# Patient Record
Sex: Male | Born: 1995 | Race: White | Hispanic: No | Marital: Single | Smoking: Never smoker
Health system: Southern US, Community
[De-identification: ages and names within clinical notes are randomized; demographics above are authoritative.]

## PROBLEM LIST (undated history)

## (undated) DIAGNOSIS — F419 Anxiety disorder, unspecified: Secondary | ICD-10-CM

## (undated) DIAGNOSIS — F329 Major depressive disorder, single episode, unspecified: Secondary | ICD-10-CM

## (undated) DIAGNOSIS — F32A Depression, unspecified: Secondary | ICD-10-CM

---

## 2015-01-14 ENCOUNTER — Other Ambulatory Visit: Payer: Self-pay | Admitting: Family Medicine

## 2015-01-14 DIAGNOSIS — IMO0002 Reserved for concepts with insufficient information to code with codable children: Secondary | ICD-10-CM

## 2015-01-14 DIAGNOSIS — R229 Localized swelling, mass and lump, unspecified: Principal | ICD-10-CM

## 2015-01-14 DIAGNOSIS — N4400 Torsion of testis, unspecified: Secondary | ICD-10-CM

## 2015-01-15 ENCOUNTER — Ambulatory Visit
Admission: RE | Admit: 2015-01-15 | Discharge: 2015-01-15 | Disposition: A | Discharge status: Home / Self Care | Payer: BLUE CROSS/BLUE SHIELD | Source: Ambulatory Visit | Attending: Family Medicine | Admitting: Family Medicine

## 2015-01-15 DIAGNOSIS — N508 Other specified disorders of male genital organs: Secondary | ICD-10-CM | POA: Insufficient documentation

## 2015-01-15 DIAGNOSIS — I861 Scrotal varices: Secondary | ICD-10-CM | POA: Insufficient documentation

## 2015-01-15 DIAGNOSIS — R229 Localized swelling, mass and lump, unspecified: Secondary | ICD-10-CM

## 2015-01-15 DIAGNOSIS — N503 Cyst of epididymis: Secondary | ICD-10-CM | POA: Insufficient documentation

## 2015-01-15 DIAGNOSIS — IMO0002 Reserved for concepts with insufficient information to code with codable children: Secondary | ICD-10-CM

## 2015-01-15 DIAGNOSIS — N4400 Torsion of testis, unspecified: Secondary | ICD-10-CM

## 2015-03-12 ENCOUNTER — Other Ambulatory Visit: Payer: Self-pay | Admitting: Psychiatry

## 2015-03-30 ENCOUNTER — Other Ambulatory Visit: Payer: Self-pay

## 2015-03-30 DIAGNOSIS — F331 Major depressive disorder, recurrent, moderate: Secondary | ICD-10-CM

## 2015-03-30 NOTE — Telephone Encounter (Signed)
faxed requested received to get a refill on trazodone 50mg  take 1 table by mouth at bedtime. 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

## 2015-03-31 MED ORDER — TRAZODONE HCL 50 MG PO TABS: 50.0000 mg | ORAL_TABLET | Freq: Every day | ORAL | Status: DC

## 2015-04-01 ENCOUNTER — Other Ambulatory Visit: Payer: Self-pay

## 2015-04-01 DIAGNOSIS — F4010 Social phobia, unspecified: Secondary | ICD-10-CM | POA: Insufficient documentation

## 2015-04-01 DIAGNOSIS — F331 Major depressive disorder, recurrent, moderate: Secondary | ICD-10-CM | POA: Insufficient documentation

## 2015-04-01 DIAGNOSIS — Z8709 Personal history of other diseases of the respiratory system: Secondary | ICD-10-CM | POA: Insufficient documentation

## 2015-05-19 ENCOUNTER — Telehealth: Payer: Self-pay | Admitting: Psychiatry

## 2015-05-19 NOTE — Telephone Encounter (Signed)
Please schedule appt for medication refill.

## 2015-11-10 ENCOUNTER — Other Ambulatory Visit: Payer: Self-pay | Admitting: Family Medicine

## 2015-11-10 ENCOUNTER — Ambulatory Visit
Admission: RE | Admit: 2015-11-10 | Discharge: 2015-11-10 | Disposition: A | Discharge status: Home / Self Care | Payer: BLUE CROSS/BLUE SHIELD | Source: Ambulatory Visit | Attending: Family Medicine | Admitting: Family Medicine

## 2015-11-10 DIAGNOSIS — S6991XA Unspecified injury of right wrist, hand and finger(s), initial encounter: Secondary | ICD-10-CM

## 2015-11-10 DIAGNOSIS — X58XXXA Exposure to other specified factors, initial encounter: Secondary | ICD-10-CM | POA: Diagnosis not present

## 2016-09-20 IMAGING — US US SCROTUM
1 series · 13 of 25 positions shown · non-contrast
Comparison: None

CLINICAL DATA: Palpable RIGHT testicular mass, assessment for tumor

EXAM:
SCROTAL ULTRASOUND
DOPPLER ULTRASOUND OF THE TESTICLES
TECHNIQUE: Complete ultrasound examination of the testicles, epididymis, and
other scrotal structures was performed. Color and spectral Doppler
ultrasound were also utilized to evaluate blood flow to the
testicles.

[Series 1: us scrotum · 0.07mm/px · 13 of 112 slices shown]
[im 1/112]
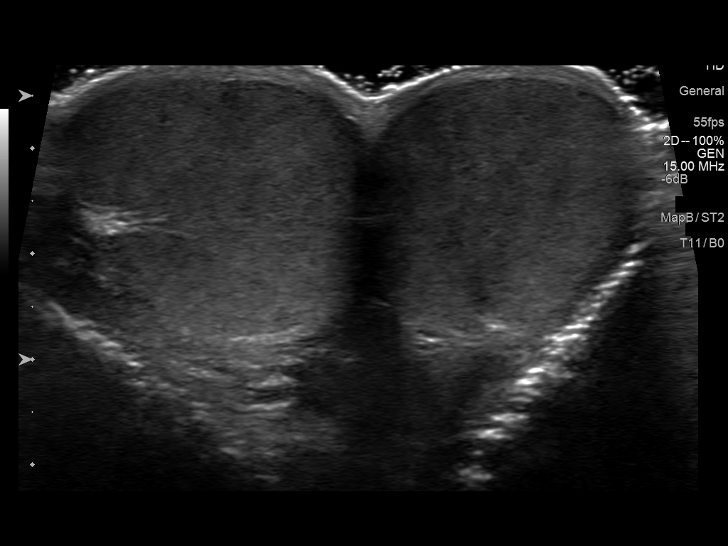
[im 10/112]
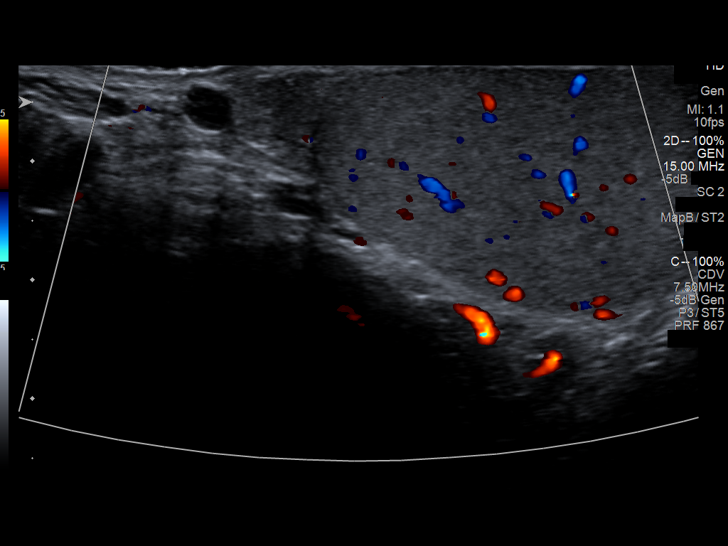
[im 19/112]
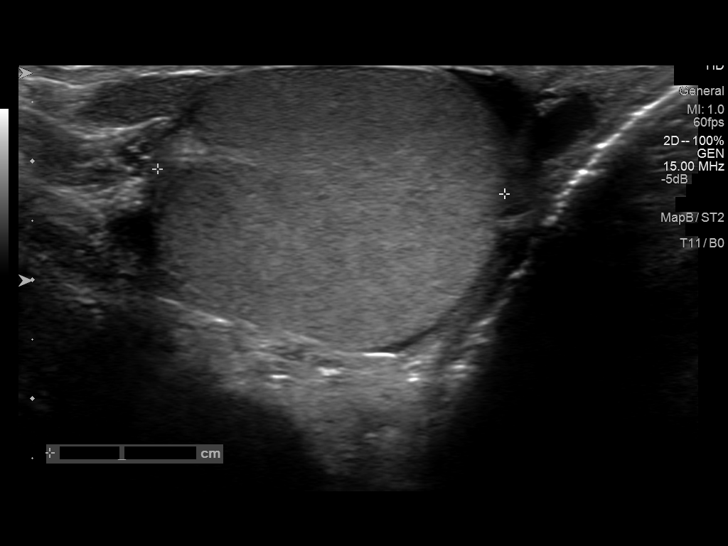
[im 28/112]
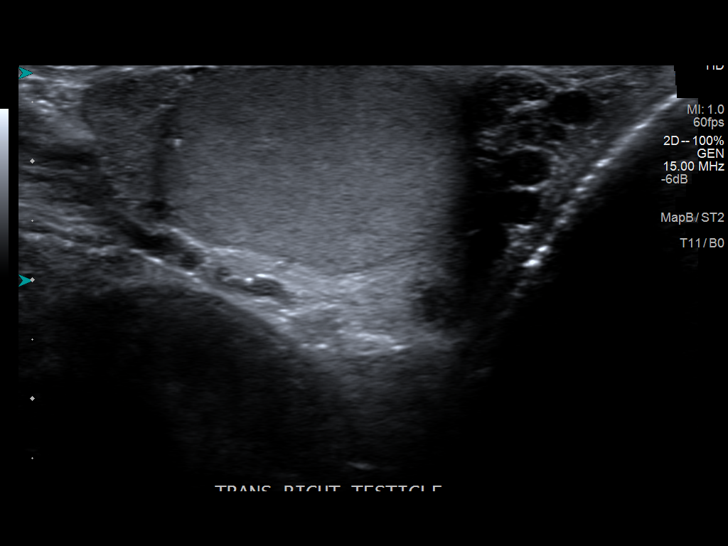
[im 38/112]
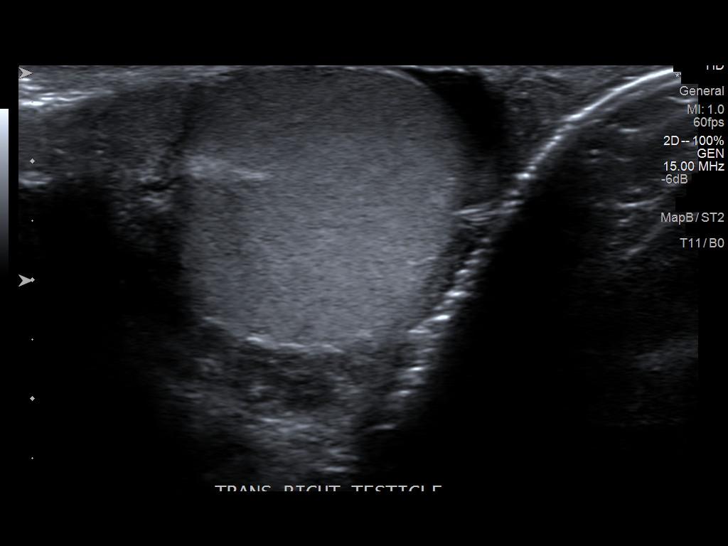
[im 47/112]
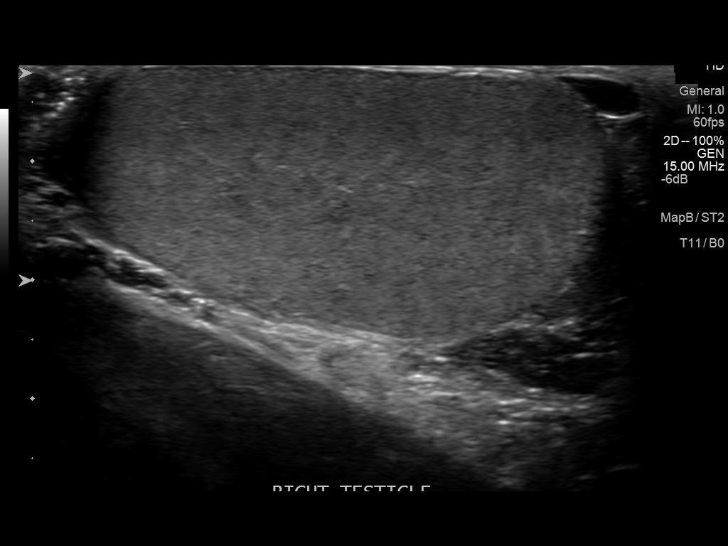
[im 56/112]
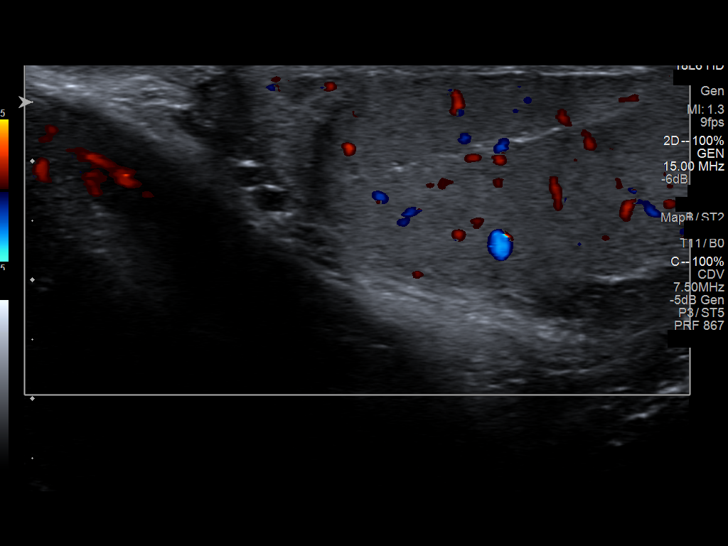
[im 65/112]
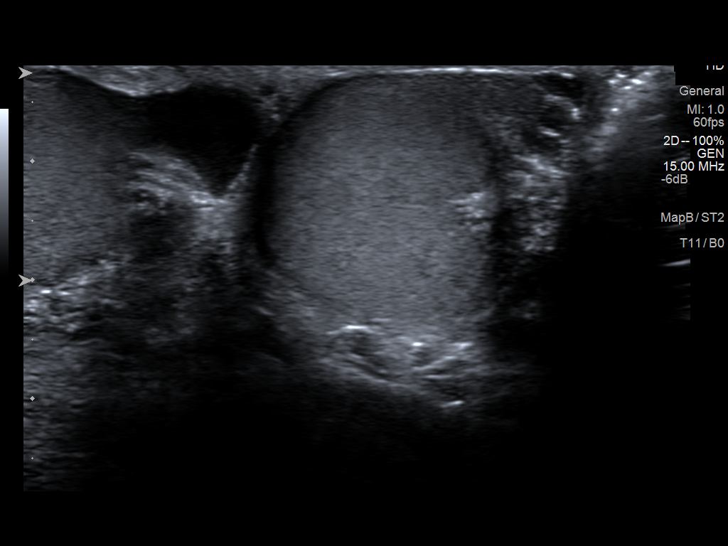
[im 75/112]
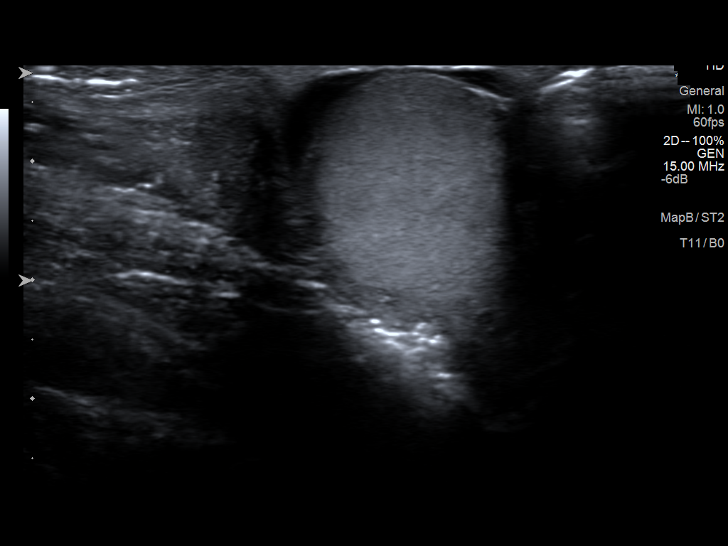
[im 84/112]
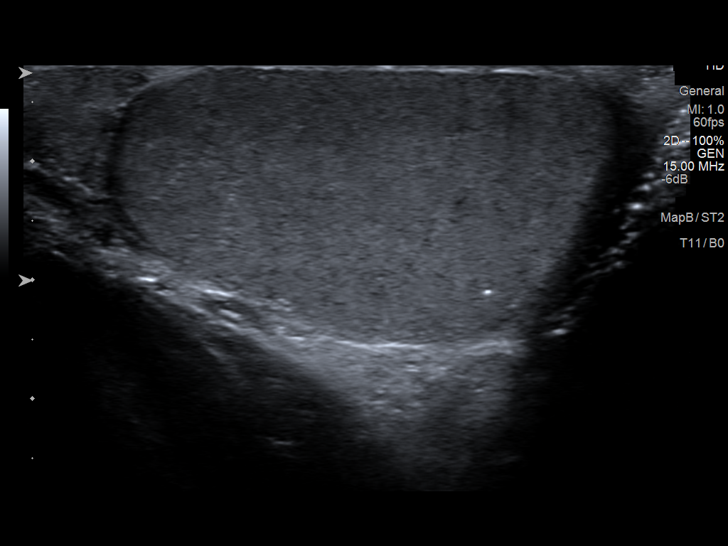
[im 93/112]
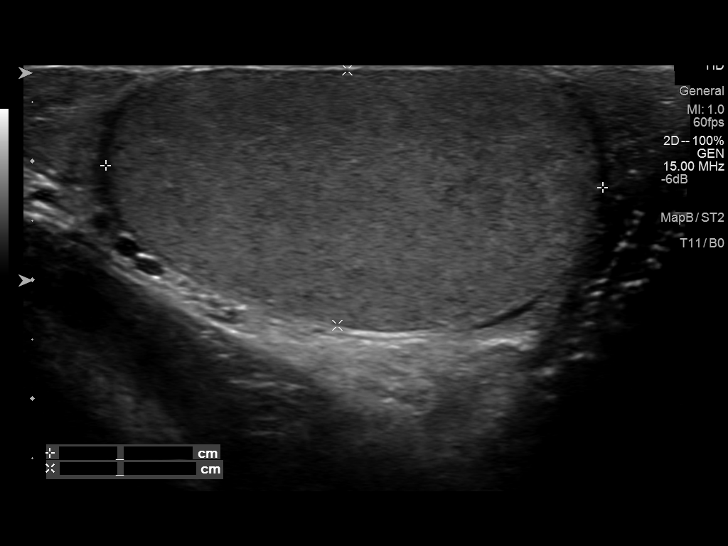
[im 102/112]
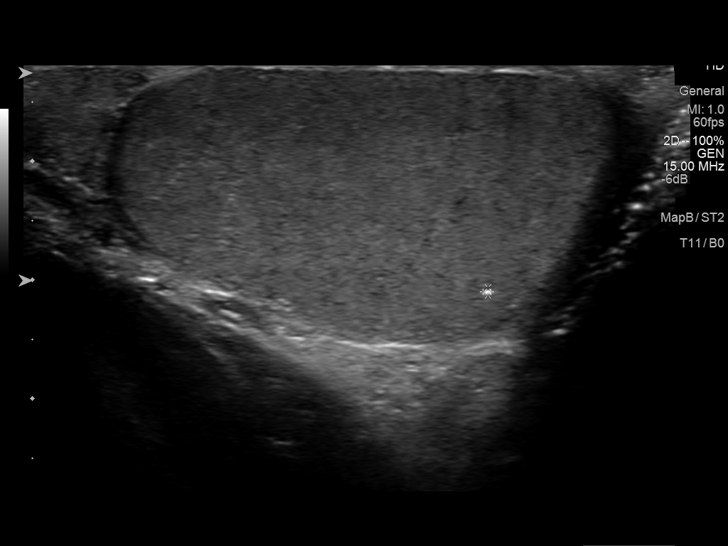
[im 112/112]
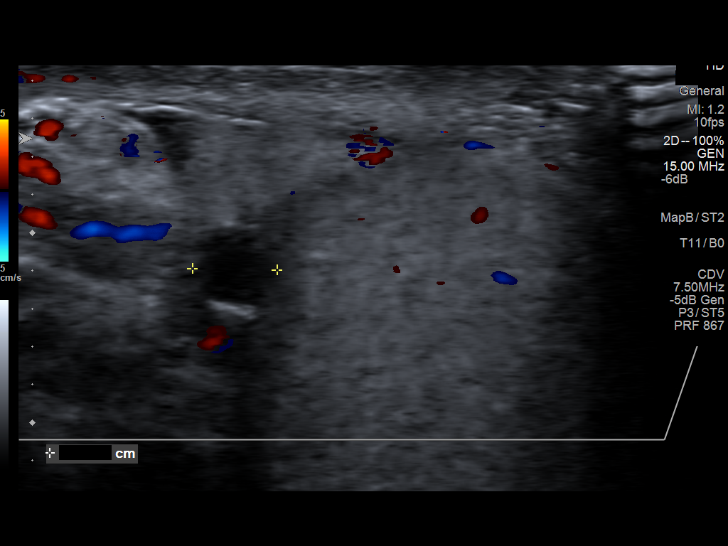

[13 of 25 positions shown; findings below may reference images not displayed]

FINDINGS: Right testicle

Measurements: 4.5 x 2.1 x 2.9 cm. Normal morphology without mass or
calcification. Internal blood flow present within RIGHT testis on
color Doppler imaging. Tiny RIGHT appendix testis.

Left testicle

Measurements: 4.2 x 2.2 x 2.5 cm. Normal morphology without mass or
calcification. Internal blood flow present on color Doppler imaging,
symmetric with RIGHT.

Right epididymis: Small RIGHT epididymal cysts noted up to 5 mm
diameter at epididymal head.

Left epididymis:  Normal in size and appearance.

Hydrocele:  None visualized.

Varicocele:  LEFT varicocele.

Pulsed Doppler interrogation of both testes demonstrates normal low
resistance arterial and venous waveforms bilaterally.
IMPRESSION: Small RIGHT epididymal cysts and tiny RIGHT appendix testis.

LEFT varicocele.

Otherwise negative exam without evidence of testicular mass.

## 2017-01-03 ENCOUNTER — Inpatient Hospital Stay
Admission: EM | Admit: 2017-01-03 | Discharge: 2017-01-05 | DRG: 372 | Disposition: A | Discharge status: Home / Self Care | Payer: BLUE CROSS/BLUE SHIELD | Attending: Internal Medicine | Admitting: Internal Medicine

## 2017-01-03 ENCOUNTER — Emergency Department
Admission: EM | Admit: 2017-01-03 | Discharge: 2017-01-03 | Disposition: A | Discharge status: Home / Self Care | Payer: BLUE CROSS/BLUE SHIELD | Source: Home / Self Care | Attending: Emergency Medicine | Admitting: Emergency Medicine

## 2017-01-03 ENCOUNTER — Encounter: Payer: Self-pay | Admitting: Emergency Medicine

## 2017-01-03 DIAGNOSIS — A046 Enteritis due to Yersinia enterocolitica: Secondary | ICD-10-CM | POA: Insufficient documentation

## 2017-01-03 DIAGNOSIS — F419 Anxiety disorder, unspecified: Secondary | ICD-10-CM | POA: Diagnosis present

## 2017-01-03 DIAGNOSIS — K529 Noninfective gastroenteritis and colitis, unspecified: Secondary | ICD-10-CM | POA: Diagnosis present

## 2017-01-03 DIAGNOSIS — R109 Unspecified abdominal pain: Secondary | ICD-10-CM

## 2017-01-03 DIAGNOSIS — K5150 Left sided colitis without complications: Secondary | ICD-10-CM | POA: Diagnosis present

## 2017-01-03 DIAGNOSIS — A058 Other specified bacterial foodborne intoxications: Secondary | ICD-10-CM

## 2017-01-03 DIAGNOSIS — F329 Major depressive disorder, single episode, unspecified: Secondary | ICD-10-CM | POA: Diagnosis present

## 2017-01-03 DIAGNOSIS — Z79899 Other long term (current) drug therapy: Secondary | ICD-10-CM | POA: Diagnosis not present

## 2017-01-03 DIAGNOSIS — R197 Diarrhea, unspecified: Secondary | ICD-10-CM

## 2017-01-03 DIAGNOSIS — A09 Infectious gastroenteritis and colitis, unspecified: Secondary | ICD-10-CM | POA: Diagnosis present

## 2017-01-03 DIAGNOSIS — K625 Hemorrhage of anus and rectum: Secondary | ICD-10-CM | POA: Diagnosis not present

## 2017-01-03 HISTORY — DX: Major depressive disorder, single episode, unspecified: F32.9

## 2017-01-03 HISTORY — DX: Depression, unspecified: F32.A

## 2017-01-03 HISTORY — DX: Anxiety disorder, unspecified: F41.9

## 2017-01-03 LAB — GASTROINTESTINAL PANEL BY PCR, STOOL (REPLACES STOOL CULTURE)

## 2017-01-03 LAB — CBC
HEMATOCRIT: 49.4 % (ref 40.0–52.0)
HEMATOCRIT: 48.3 % (ref 40.0–52.0)
HEMOGLOBIN: 17 g/dL (ref 13.0–18.0)
Hemoglobin: 16.5 g/dL (ref 13.0–18.0)
MCH: 31.2 pg (ref 26.0–34.0)
MCH: 31.3 pg (ref 26.0–34.0)
MCHC: 34.4 g/dL (ref 32.0–36.0)
MCHC: 34.1 g/dL (ref 32.0–36.0)
MCV: 90.7 fL (ref 80.0–100.0)
MCV: 91.8 fL (ref 80.0–100.0)
PLATELETS: 309 10*3/uL (ref 150–440)
Platelets: 340 10*3/uL (ref 150–440)
RBC: 5.45 MIL/uL (ref 4.40–5.90)
RBC: 5.26 MIL/uL (ref 4.40–5.90)
RDW: 12.3 % (ref 11.5–14.5)
RDW: 12.2 % (ref 11.5–14.5)
WBC: 10.9 10*3/uL — AB (ref 3.8–10.6)
WBC: 16.6 10*3/uL — ABNORMAL HIGH (ref 3.8–10.6)

## 2017-01-03 LAB — BASIC METABOLIC PANEL
ANION GAP: 8 (ref 5–15)
BUN: 10 mg/dL (ref 6–20)
CALCIUM: 9.4 mg/dL (ref 8.9–10.3)
CO2: 29 mmol/L (ref 22–32)
CREATININE: 0.9 mg/dL (ref 0.61–1.24)
Chloride: 102 mmol/L (ref 101–111)
GFR calc Af Amer: >60 mL/min (ref >60)
GLUCOSE: 103 mg/dL — AB (ref 65–99)
Potassium: 3.9 mmol/L (ref 3.5–5.1)
Sodium: 139 mmol/L (ref 135–145)

## 2017-01-03 LAB — COMPREHENSIVE METABOLIC PANEL
ALBUMIN: 4.5 g/dL (ref 3.5–5.0)
ALT: 16 U/L — ABNORMAL LOW (ref 17–63)
ANION GAP: 8 (ref 5–15)
AST: 20 U/L (ref 15–41)
Alkaline Phosphatase: 50 U/L (ref 38–126)
BUN: 13 mg/dL (ref 6–20)
CHLORIDE: 103 mmol/L (ref 101–111)
CO2: 27 mmol/L (ref 22–32)
Calcium: 9.7 mg/dL (ref 8.9–10.3)
Creatinine, Ser: 1.08 mg/dL (ref 0.61–1.24)
GFR calc Af Amer: >60 mL/min (ref >60)
GFR calc non Af Amer: >60 mL/min (ref >60)
GLUCOSE: 116 mg/dL — AB (ref 65–99)
Potassium: 3.8 mmol/L (ref 3.5–5.1)
Sodium: 138 mmol/L (ref 135–145)
Total Bilirubin: 0.8 mg/dL (ref 0.3–1.2)
Total Protein: 7.7 g/dL (ref 6.5–8.1)

## 2017-01-03 LAB — LIPASE, BLOOD: LIPASE: 30 U/L (ref 11–51)

## 2017-01-03 MED ORDER — SULFAMETHOXAZOLE-TRIMETHOPRIM 800-160 MG PO TABS
1.0000 | ORAL_TABLET | Freq: Two times a day (BID) | ORAL | Status: DC
  Administered 2017-01-03 – 2017-01-05 (×4): 1 via ORAL
  Filled 2017-01-03 (×6): qty 1

## 2017-01-03 MED ORDER — ACETAMINOPHEN 650 MG RE SUPP: 650.0000 mg | Freq: Four times a day (QID) | RECTAL | Status: DC | PRN

## 2017-01-03 MED ORDER — ONDANSETRON HCL 4 MG/2ML IJ SOLN
4.0000 mg | Freq: Once | INTRAMUSCULAR | Status: AC
  Administered 2017-01-03: 4 mg via INTRAVENOUS
  Filled 2017-01-03: qty 2

## 2017-01-03 MED ORDER — MORPHINE SULFATE (PF) 4 MG/ML IV SOLN
4.0000 mg | Freq: Once | INTRAVENOUS | Status: AC
  Administered 2017-01-03: 4 mg via INTRAVENOUS
  Filled 2017-01-03: qty 1

## 2017-01-03 MED ORDER — ACETAMINOPHEN 325 MG PO TABS
650.0000 mg | ORAL_TABLET | Freq: Four times a day (QID) | ORAL | Status: DC | PRN
  Administered 2017-01-03 – 2017-01-04 (×3): 650 mg via ORAL
  Filled 2017-01-03 (×3): qty 2

## 2017-01-03 MED ORDER — FENTANYL CITRATE (PF) 100 MCG/2ML IJ SOLN
75.0000 ug | Freq: Once | INTRAMUSCULAR | Status: AC
Start: 2017-01-03 — End: 2017-01-03
  Administered 2017-01-03: 75 ug via INTRAVENOUS

## 2017-01-03 MED ORDER — TRAZODONE HCL 50 MG PO TABS
50.0000 mg | ORAL_TABLET | Freq: Every day | ORAL | Status: DC
  Administered 2017-01-03 – 2017-01-04 (×2): 50 mg via ORAL
  Filled 2017-01-03 (×3): qty 1

## 2017-01-03 MED ORDER — SODIUM CHLORIDE 0.9 % IV BOLUS (SEPSIS)
1000.0000 mL | Freq: Once | INTRAVENOUS | Status: AC
  Administered 2017-01-03: 1000 mL via INTRAVENOUS

## 2017-01-03 MED ORDER — SODIUM CHLORIDE 0.9 % IV SOLN
INTRAVENOUS | Status: DC
  Administered 2017-01-03 – 2017-01-05 (×4): via INTRAVENOUS

## 2017-01-03 MED ORDER — CIPROFLOXACIN HCL 500 MG PO TABS
ORAL_TABLET | ORAL | Status: AC
  Filled 2017-01-03: qty 1

## 2017-01-03 MED ORDER — ONDANSETRON HCL 4 MG/2ML IJ SOLN: 4.0000 mg | Freq: Four times a day (QID) | INTRAMUSCULAR | Status: DC | PRN

## 2017-01-03 MED ORDER — ARIPIPRAZOLE 2 MG PO TABS
2.0000 mg | ORAL_TABLET | Freq: Every day | ORAL | Status: DC
  Filled 2017-01-03 (×2): qty 1

## 2017-01-03 MED ORDER — CIPROFLOXACIN HCL 500 MG PO TABS
500.0000 mg | ORAL_TABLET | Freq: Once | ORAL | Status: AC
  Administered 2017-01-03: 500 mg via ORAL

## 2017-01-03 MED ORDER — ONDANSETRON HCL 4 MG PO TABS: 4.0000 mg | ORAL_TABLET | Freq: Four times a day (QID) | ORAL | Status: DC | PRN

## 2017-01-03 MED ORDER — FENTANYL CITRATE (PF) 100 MCG/2ML IJ SOLN
INTRAMUSCULAR | Status: AC
  Filled 2017-01-03: qty 2

## 2017-01-03 NOTE — ED Triage Notes (Signed)
Pt just discharged earlier today with food poisoning, states he went home and had some episodes of rectal bleeding. NAD, skin warm and dry.

## 2017-01-03 NOTE — Discharge Instructions (Signed)
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  Return to the emergency room immediately for any worsening symptoms including black or bloody stools, worsening diarrhea with concern for dehydration such as dry mouth and not making urine, or any new or worsening abdominal pain.

## 2017-01-03 NOTE — ED Notes (Signed)
Pt reports that he has blood in his stool - he states he was seen earlier today in the er for food poisoning - pt reports the blood is bright red in color and has only occurred once today

## 2017-01-03 NOTE — ED Triage Notes (Signed)
Pt presents to ED via [facility] with c/o lower abdominal pain that started at 0720 today. EMS reports patient had 1 episode of diarrhea this morning, no blood noted. Pt states previous diagnosis of "some kind of stomach infection" in December. Pt is alert and oriented, noted to be anxious on arrival.

## 2017-01-03 NOTE — H&P (Signed)
[employer] [facility] [place] [facility]   PATIENT NAME: Jesus Kelly    MR#:  [id number]  DATE OF BIRTH:  02-16-1996  DATE OF ADMISSION:  01/03/2017  PRIMARY CARE PHYSICIAN: Jesus Kelly   REQUESTING/REFERRING PHYSICIAN: Koren Bound MD  CHIEF COMPLAINT:   Chief Complaint  Patient presents with  . Rectal Bleeding    HISTORY OF PRESENT ILLNESS: Jesus Kelly  is a 21 y.o. male with a known history of Anxiety, depression who presents to the emergency room complaining of having rectal bleeding. Patient was seen in the emergency room earlier today with diarrhea that started earlier this morning. He reports that he actually got sick 2 weeks ago with nausea vomiting and diarrhea. After he cleaned up his room because his roommate was very sick after drinking. Then his symptoms went away and then they recurred today. He initially had liquidy bowel movement. He was given IV fluids and discharged home. His stool studies showed that he had Yersinia enterocolitica. Patient after going home started having lot of bright red blood per rectum. Therefore comes back to the emergency room. His hemoglobin is stable however his white blood cell count is increased. He denies any fevers or chills today. PAST MEDICAL HISTORY:   Past Medical History:  Diagnosis Date  . Anxiety   . Depression     PAST SURGICAL HISTORY: History reviewed. No pertinent surgical history.  SOCIAL HISTORY:  Social History  Substance Use Topics  . Smoking status: Never Smoker  . Smokeless tobacco: Never Used  . Alcohol use Yes     Comment: On weekends    FAMILY HISTORY:  Diabetes in aunt DRUG ALLERGIES: No Known Allergies  REVIEW OF SYSTEMS:   CONSTITUTIONAL: No fever, fatigue or weakness.  EYES: No blurred or double vision.  EARS, NOSE, AND THROAT: No tinnitus or ear pain.  RESPIRATORY: No cough, shortness of breath, wheezing or hemoptysis.  CARDIOVASCULAR: No chest pain, orthopnea, edema.  GASTROINTESTINAL:  No nausea, vomiting, positive diarrhea or resolved abdominal pain.  GENITOURINARY: No dysuria, hematuria.  ENDOCRINE: No polyuria, nocturia,  HEMATOLOGY: No anemia, easy bruising or bleeding SKIN: No rash or lesion. MUSCULOSKELETAL: No joint pain or arthritis.   NEUROLOGIC: No tingling, numbness, weakness.  PSYCHIATRY: No anxiety or depression.   MEDICATIONS AT HOME:  Prior to Admission medications   Medication Sig Start Date End Date Taking? Authorizing Provider  ARIPiprazole (ABILIFY) 2 MG tablet Take 2 mg by mouth at bedtime.    Yes Historical Provider, MD  traZODone (DESYREL) 50 MG tablet TAKE 1 TABLET BY MOUTH AT BEDTIME Patient not taking: Reported on 01/03/2017 03/31/15   Jesus Hale, MD  traZODone (DESYREL) 50 MG tablet Take 1 tablet (50 mg total) by mouth at bedtime. Patient not taking: Reported on 01/03/2017 03/31/15   Jesus Hale, MD      PHYSICAL EXAMINATION:   VITAL SIGNS: Blood pressure (!) 122/57, pulse 61, temperature 98.7 F (37.1 C), temperature source Oral, resp. rate 18, height 6' (1.829 m), weight 150 lb (68 kg), SpO2 100 %.  GENERAL:  21 y.o.-year-old patient lying in the bed with no acute distress.  EYES: Pupils equal, round, reactive to light and accommodation. No scleral icterus. Extraocular muscles intact.  HEENT: Head atraumatic, normocephalic. Oropharynx and nasopharynx clear.  NECK:  Supple, no jugular venous distention. No thyroid enlargement, no tenderness.  LUNGS: Normal breath sounds bilaterally, no wheezing, rales,rhonchi or crepitation. No use of accessory muscles of respiration.  CARDIOVASCULAR: S1, S2 normal.  No murmurs, rubs, or gallops.  ABDOMEN: Soft, nontender, nondistended. Bowel sounds present. No organomegaly or mass.  EXTREMITIES: No pedal edema, cyanosis, or clubbing.  NEUROLOGIC: Cranial nerves II through XII are intact. Muscle strength 5/5 in all extremities. Sensation intact. Gait not checked.  PSYCHIATRIC: The patient is alert and  oriented x 3.  SKIN: No obvious rash, lesion, or ulcer.   LABORATORY PANEL:   CBC  Recent Labs Lab 01/03/17 0831 01/03/17 1908  WBC 10.9* 16.6*  HGB 17.0 16.5  HCT 49.4 48.3  PLT 340 309  MCV 90.7 91.8  MCH 31.2 31.3  MCHC 34.4 34.1  RDW 12.3 12.2   ------------------------------------------------------------------------------------------------------------------  Chemistries   Recent Labs Lab 01/03/17 0831 01/03/17 1908  NA 138 139  K 3.8 3.9  CL 103 102  CO2 27 29  GLUCOSE 116* 103*  BUN 13 10  CREATININE 1.08 0.90  CALCIUM 9.7 9.4  AST 20  --   ALT 16*  --   ALKPHOS 50  --   BILITOT 0.8  --    ------------------------------------------------------------------------------------------------------------------ estimated creatinine clearance is 123.8 mL/min (by C-G formula based on SCr of 0.9 mg/dL). ------------------------------------------------------------------------------------------------------------------ No results for input(s): TSH, T4TOTAL, T3FREE, THYROIDAB in the last 72 hours.  Invalid input(s): FREET3   Coagulation profile No results for input(s): INR, PROTIME in the last 168 hours. ------------------------------------------------------------------------------------------------------------------- No results for input(s): DDIMER in the last 72 hours. -------------------------------------------------------------------------------------------------------------------  Cardiac Enzymes No results for input(s): CKMB, TROPONINI, MYOGLOBIN in the last 168 hours.  Invalid input(s): CK ------------------------------------------------------------------------------------------------------------------ Invalid input(s): POCBNP  ---------------------------------------------------------------------------------------------------------------  Urinalysis No results found for: COLORURINE, APPEARANCEUR, LABSPEC, PHURINE, GLUCOSEU, HGBUR, BILIRUBINUR, KETONESUR,  PROTEINUR, UROBILINOGEN, NITRITE, LEUKOCYTESUR   RADIOLOGY: No results found.  EKG: No orders found for this or any previous visit.  IMPRESSION AND PLAN: Patient is a 21 year old presenting with bloody diarrhea  1. Yersinia enterocolitica At this point we will follow his hemoglobin. I will start him on Bactrim. If his abdominal pain worsens or WBC count increases will need a CT of the abdomen We'll do clear liquid diet  2. Depression and anxiety will discontinue his home regimen  All the records are reviewed and case discussed with ED provider. Management plans discussed with the patient, family and they are in agreement.  CODE STATUS: Code Status History    This patient does not have a recorded code status. Please follow your organizational policy for patients in this situation.       TOTAL TIME TAKING CARE OF THIS PATIENT: 50 minutes.    Auburn Bilberry M.D on 01/03/2017 at 8:25 PM  Between 7am to 6pm - Pager - [phone]  After 6pm go to [email or web address] - password [email or web address]  [facility]  Office  [phone]  CC: Primary care physician; Jesus Kelly

## 2017-01-03 NOTE — ED Provider Notes (Signed)
[facility] Emergency Department Provider Note ____________________________________________   I have reviewed the triage vital signs and the triage nursing note.  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  Moderate nausea without significant vomiting.  He did have vomiting and diarrhea last week after his roommate had similar symptoms, but that seemed to be better.  Reports edible marijuana yesterday.  No chest pain or trouble breathing. No fever. No known bad food. No significant travel history.    Past Medical History:  Diagnosis Date  . Anxiety   . Depression     Patient Active Problem List   Diagnosis Date Noted  . History of asthma 04/01/2015  . Depression, major, recurrent, moderate (HCC) 04/01/2015  . 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 No pertinent surgical history.  Prior to Admission medications   Medication Sig Start Date End Date Taking? Authorizing Provider  escitalopram (LEXAPRO) 10 MG tablet Take by mouth. 01/13/15   Historical Provider, MD  traZODone (DESYREL) 50 MG tablet TAKE 1 TABLET BY MOUTH AT BEDTIME 03/31/15   Brandy Hale, MD  traZODone (DESYREL) 50 MG tablet Take 1 tablet (50 mg total) by mouth at bedtime. 03/31/15   Brandy Hale, MD    No Known Allergies  History reviewed. No pertinent family history.  Social History Social History  Substance Use Topics  . Smoking status: Never Smoker  . Smokeless tobacco: Never Used  . Alcohol use Yes     Comment: On weekends    Review of Systems  Constitutional: Negative for fever. Eyes: Negative for visual changes. ENT: Negative for sore throat. Cardiovascular: Negative for chest pain. Respiratory: Negative for shortness of breath. Gastrointestinal: No bloody stool. Genitourinary:  Negative for dysuria. Musculoskeletal: Negative for back pain. Skin: Negative for rash. Neurological: Negative for headache. 10 point Review of Systems otherwise negative ____________________________________________   PHYSICAL EXAM:  VITAL SIGNS: ED Triage Vitals  Enc Vitals Group     BP 01/03/17 0839 138/88     Pulse Rate 01/03/17 0839 60     Resp 01/03/17 0839 17     Temp --      Temp src --      SpO2 01/03/17 0838 99 %     Weight 01/03/17 0839 150 lb (68 kg)     Height 01/03/17 0839 6' 0.5" (1.842 m)     Head Circumference --      Peak Flow --      Pain Score 01/03/17 0839 6     Pain Loc --      Pain Edu? --      Excl. in GC? --      Constitutional: Alert and oriented. Well appearing and in no distress. HEENT   Head: Normocephalic and atraumatic.      Eyes: Conjunctivae are normal. PERRL. Normal extraocular movements.      Ears:         Nose: No congestion/rhinnorhea.   Mouth/Throat: Mucous membranes are moist.   Neck: No stridor. Cardiovascular/Chest: Normal rate, regular rhythm.  No murmurs, rubs, or gallops. Respiratory: Normal respiratory effort without tachypnea nor retractions. Breath sounds are clear and equal bilaterally. No wheezes/rales/rhonchi. Gastrointestinal: Soft. No distention, no guarding, no rebound. Mild tenderness diffusely more so over the suprapubic area without any focal McBurney's point tenderness.  Genitourinary/rectal:Deferred Musculoskeletal: Nontender  with normal range of motion in all extremities. No joint effusions.  No lower extremity tenderness.  No edema. Neurologic:  Normal speech and language. No gross or focal neurologic deficits are appreciated. Skin:  Skin is warm, dry and intact. No rash noted. Psychiatric: Mood and affect are normal. Speech and behavior are normal. Patient exhibits appropriate insight and judgment.   ____________________________________________  LABS (pertinent positives/negatives)  Labs Reviewed   GASTROINTESTINAL PANEL BY PCR, STOOL (REPLACES STOOL CULTURE) - Abnormal; Notable for the following:       Result Value   Yersinia enterocolitica DETECTED (*)    All other components within normal limits  COMPREHENSIVE METABOLIC PANEL - Abnormal; Notable for the following:    Glucose, Bld 116 (*)    ALT 16 (*)    All other components within normal limits  CBC - Abnormal; Notable for the following:    WBC 10.9 (*)    All other components within normal limits  LIPASE, BLOOD  URINALYSIS, COMPLETE (UACMP) WITH MICROSCOPIC    ____________________________________________    EKG I, Governor Rooks, MD, the attending physician have personally viewed and interpreted all ECGs.  None ____________________________________________  RADIOLOGY All Xrays were viewed by me. Imaging interpreted by Radiologist.  None __________________________________________  PROCEDURES  Procedure(s) performed: None  Critical Care performed: None  ____________________________________________   ED COURSE / ASSESSMENT AND PLAN  Pertinent labs & imaging results that were available during my care of the patient were reviewed by me and considered in my medical decision making (see chart for details).   Mr. Pulse came in by EMS for episode of severe cramping associated with ultimately multiple episodes of watery diarrhea this morning. He's feeling little bit better than he did earlier.  Reassuring vital signs. Overall reassuring physical exam. Does not appear clinically dehydrated, but I am going to give him a 1 L normal saline bolus with some symptomatic medications. Abdomen exam seems less likely for surgical abdominal emergency, and more consistent with the history of GI illness.  He did have a diarrhea bowel movement here and it came back positive for Yersinia enterocolitica.  I did discuss with him, and he did say that he had bacon.  I reviewed Up To Date recommendations regarding treatment -- will hold  off on any antibiotics at this point, mild illness.  He feels better clinically. We discussed what to return for.  I'm not recommending CT imaging, and he understands this plan.    CONSULTATIONS:   None   Patient / Family / Caregiver informed of clinical course, medical decision-making process, and agree with plan.   I discussed return precautions, follow-up instructions, and discharge instructions with patient and/or family.   ___________________________________________   FINAL CLINICAL IMPRESSION(S) / ED DIAGNOSES   Final diagnoses:  Yersinia enterocolitica food poisoning              Note: This dictation was prepared with Dragon dictation. Any transcriptional errors that result from this process are unintentional    Governor Rooks, MD 01/03/17 1158

## 2017-01-03 NOTE — ED Notes (Signed)
Pt visualized in NAD at this time, sitting in bed playing on his phone. Pt denies any needs, states pain is decreased to 4/10. Reiterated patient safety and call bell use. Pt states understanding. Will continue to monitor for further patient needs.

## 2017-01-03 NOTE — ED Provider Notes (Addendum)
[facility] Emergency Department Provider Note  ____________________________________________  Time seen: Approximately 7:09 PM  I have reviewed the triage vital signs and the nursing notes.   HISTORY  Chief Complaint Rectal Bleeding    HPI EMIDIO WARRELL is a 21 y.o. male diagnosed with Yersinia enterocolitis likely from bacon earlier today returns to the emergency department for the onset of rectal bleeding. The patient reports that he was seen earlier today for severe abdominal cramping associated with multiple episodes of diarrhea without nausea or vomiting. A stool sample was obtained which was positive for Yersinia enterocolitis. On arrival home, the patient has had multiple episodes of bright red blood per rectum or water mixed with bright red blood. He has not had any lightheadedness, shortness of breath, or fainting. No fevers or chills. His abdominal pain has significantly improved compared to earlier today.At the time of discharge, the patient's symptoms were mild and no antibiotic therapy was indicated.   Past Medical History:  Diagnosis Date  . Anxiety   . Depression     Patient Active Problem List   Diagnosis Date Noted  . History of asthma 04/01/2015  . Depression, major, recurrent, moderate (HCC) 04/01/2015  . 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 No pertinent surgical history.  Current Outpatient Rx  . Order #: [id number] Class: Historical Med  . Order #: [id number] Class: Normal  . Order #: [id number] Class: Normal    Allergies Patient has no known allergies.  No family history on file.  Social History Social History  Substance Use Topics  . Smoking status: Never Smoker  . Smokeless tobacco: Never Used  . Alcohol use Yes     Comment: On weekends    Review of Systems Constitutional: No fever/chills. Eyes: No visual changes. ENT: No sore throat. No congestion or rhinorrhea. Cardiovascular: Denies chest pain. Denies  palpitations. Respiratory: Denies shortness of breath.  No cough. Gastrointestinal: Positive diffuse abdominal pain.  No nausea, no vomiting.  Positive [place] diarrhea.  No constipation. Genitourinary: Negative for dysuria.Positive rectal bleeding. Musculoskeletal: Negative for back pain. Skin: Negative for rash. Neurological: Negative for headaches. No focal numbness, tingling or weakness.   10-point ROS otherwise negative.  ____________________________________________   PHYSICAL EXAM:  VITAL SIGNS: ED Triage Vitals  Enc Vitals Group     BP 01/03/17 1806 (!) 122/57     Pulse Rate 01/03/17 1806 61     Resp 01/03/17 1806 18     Temp 01/03/17 1806 98.7 F (37.1 C)     Temp Source 01/03/17 1806 Oral     SpO2 01/03/17 1806 100 %     Weight 01/03/17 1806 150 lb (68 kg)     Height 01/03/17 1806 6' (1.829 m)     Head Circumference --      Peak Flow --      Pain Score 01/03/17 1849 5     Pain Loc --      Pain Edu? --      Excl. in GC? --     Constitutional: Alert and oriented. Well appearing and in no acute distress. Answers questions appropriately. Eyes: Conjunctivae are normal.  EOMI. No scleral icterus. Head: Atraumatic. Nose: No congestion/rhinnorhea. Mouth/Throat: Mucous membranes are moist.  Neck: No stridor.  Supple.   Cardiovascular: Normal rate, regular rhythm. No murmurs, rubs or gallops.  Respiratory: Normal respiratory effort.  No accessory muscle use or retractions. Lungs CTAB.  No wheezes, rales or ronchi. Gastrointestinal: Soft, nontender and nondistended. No reproducible tenderness on  examination. No guarding or rebound.  No peritoneal signs. Genitourinary: No evidence of external or palpable internal hemorrhoids. The vault is empty of stool but there is some mild bloody mucous that is guaiac positive. No significant discomfort with rectal examination. Musculoskeletal: No LE edema.  Neurologic:  A&Ox3.  Speech is clear.  Face and smile are symmetric.  EOMI.  Moves  all extremities well. Skin:  Skin is warm, dry and intact. No rash noted. Psychiatric: Mood and affect are normal. Speech and behavior are normal.  Normal judgement.  ____________________________________________   LABS (all labs ordered are listed, but only abnormal results are displayed)  Labs Reviewed  CBC - Abnormal; Notable for the following:       Result Value   WBC 16.6 (*)    All other components within normal limits  BASIC METABOLIC PANEL - Abnormal; Notable for the following:    Glucose, Bld 103 (*)    All other components within normal limits   ____________________________________________  EKG  Not indicated ____________________________________________  RADIOLOGY  No results found.  ____________________________________________   PROCEDURES  Procedure(s) performed: None  Procedures  Critical Care performed: No ____________________________________________   INITIAL IMPRESSION / ASSESSMENT AND PLAN / ED COURSE  Pertinent labs & imaging results that were available during my care of the patient were reviewed by me and considered in my medical decision making (see chart for [id number]).  20 y.o. male with recent diagnosis of Yersinia enterocolitis this morning returning to the emergency department for bright red blood per rectum. This is likely due to his infection, although IBD such as Crohn's disease or ulcerative colitis given recurrent episodes of diarrhea over the past several weeks is also possible. We will recheck his blood work, including blood counts. I will initiate him on ciprofloxacin although the data is unclear that antibiotic therapy will have a significant impact on his clinical course, it is indicated due to the increased severity of his infection presentation today. Reevaluation for final disposition.  ----------------------------------------- 7:45 PM on 01/03/2017 -----------------------------------------  The patient does have significant bump  in his white blood cell count from 10 earlier today to 16 now. His abdomen is soft and benign, but your skin he enterocolitis is known to cause intestinal complications including abscess or perforation. Given that he is already having bloody diarrhea, I'll plan to admit the patient for serial blood counts and monitoring of his GI bleed, as well as serial abdominal exams. No CT imaging is indicated at this time, but the admitting doctors will follow his clinical course and consider imaging for worsening symptoms.  I have spoken to the patient's mother about the plan.  ____________________________________________  FINAL CLINICAL IMPRESSION(S) / ED DIAGNOSES  Final diagnoses:  Yersinia enterocolitica food poisoning  Bloody diarrhea         NEW MEDICATIONS STARTED DURING THIS VISIT:  New Prescriptions   No medications on file      Rockne Menghini, MD 01/03/17 1946    Rockne Menghini, MD 01/03/17 2002

## 2017-01-03 NOTE — ED Notes (Signed)
This RN to bedside, pain medications administered per MD order. This Rn also able to collect GI panel. Pt is noted to continue to be alert and oriented at this time. NAD noted. Pt resting in bed at this time watching TV. Lights dimmed for patient comfort. Pt instructed to use call bell due to increased fall risk from receiving IV pain medications, pt states understanding. VSS and WNL. Will continue to monitor for further patient needs.

## 2017-01-03 NOTE — ED Notes (Signed)
Date and time results received: 01/03/17 1120 (use smartphrase ".now" to insert current time)  Test: GI panel Critical Value: Yersinia Enterocolitica  Name of Provider Notified: Dr. Shaune Pollack  Orders Received? Or Actions Taken?: No new orders

## 2017-01-04 ENCOUNTER — Inpatient Hospital Stay: Payer: BLUE CROSS/BLUE SHIELD

## 2017-01-04 LAB — CBC
HCT: 43.4 % (ref 40.0–52.0)
Hemoglobin: 15 g/dL (ref 13.0–18.0)
MCH: 31.6 pg (ref 26.0–34.0)
MCHC: 34.5 g/dL (ref 32.0–36.0)
MCV: 91.5 fL (ref 80.0–100.0)
PLATELETS: 256 10*3/uL (ref 150–440)
RBC: 4.74 MIL/uL (ref 4.40–5.90)
RDW: 12.2 % (ref 11.5–14.5)
WBC: 10.8 10*3/uL — AB (ref 3.8–10.6)

## 2017-01-04 LAB — BASIC METABOLIC PANEL
ANION GAP: 0 — AB (ref 5–15)
BUN: 8 mg/dL (ref 6–20)
CALCIUM: 8.6 mg/dL — AB (ref 8.9–10.3)
CO2: 31 mmol/L (ref 22–32)
Chloride: 109 mmol/L (ref 101–111)
Creatinine, Ser: 0.85 mg/dL (ref 0.61–1.24)
Glucose, Bld: 95 mg/dL (ref 65–99)
Potassium: 3.9 mmol/L (ref 3.5–5.1)
Sodium: 140 mmol/L (ref 135–145)

## 2017-01-04 MED ORDER — MORPHINE SULFATE (PF) 2 MG/ML IV SOLN
2.0000 mg | INTRAVENOUS | Status: DC | PRN
  Administered 2017-01-04 (×2): 2 mg via INTRAVENOUS
  Filled 2017-01-04 (×2): qty 1

## 2017-01-04 MED ORDER — IOPAMIDOL (ISOVUE-300) INJECTION 61%
100.0000 mL | Freq: Once | INTRAVENOUS | Status: AC | PRN
  Administered 2017-01-04: 14:00:00 100 mL via INTRAVENOUS

## 2017-01-04 NOTE — Progress Notes (Signed)
[facility] - [place] at [facility]   PATIENT NAME: Jesus Kelly    MR#:  [id number]  DATE OF BIRTH:  October 15, 1995  SUBJECTIVE:  CHIEF COMPLAINT:   Chief Complaint  Patient presents with  . Rectal Bleeding   - Came in with abdominal pain and bloody diarrhea. Has had 10 episodes since yesterday. -Pain is improved with morphine today. Still having bloody diarrhea  REVIEW OF SYSTEMS:  Review of Systems  Constitutional: Negative for chills, fever and malaise/fatigue.  HENT: Negative for ear discharge, hearing loss and nosebleeds.   Eyes: Negative for blurred vision and double vision.  Respiratory: Negative for cough, shortness of breath and wheezing.   Cardiovascular: Negative for chest pain, palpitations and leg swelling.  Gastrointestinal: Positive for abdominal pain, blood in stool and diarrhea. Negative for constipation, nausea and vomiting.  Genitourinary: Negative for dysuria.  Musculoskeletal: Negative for myalgias.  Neurological: Negative for dizziness, speech change, focal weakness, seizures and headaches.  Psychiatric/Behavioral: Negative for depression.    DRUG ALLERGIES:  No Known Allergies  VITALS:  Blood pressure 124/68, pulse 60, temperature 97.8 F (36.6 C), temperature source Oral, resp. rate 19, height 6' (1.829 m), weight 68 kg (150 lb), SpO2 99 %.  PHYSICAL EXAMINATION:  Physical Exam  GENERAL:  21 y.o.-year-old patient lying in the bed with no acute distress.  EYES: Pupils equal, round, reactive to light and accommodation. No scleral icterus. Extraocular muscles intact.  HEENT: Head atraumatic, normocephalic. Oropharynx and nasopharynx clear.  NECK:  Supple, no jugular venous distention. No thyroid enlargement, no tenderness.  LUNGS: Normal breath sounds bilaterally, no wheezing, rales,rhonchi or crepitation. No use of accessory muscles of respiration.  CARDIOVASCULAR: S1, S2 normal. No murmurs, rubs, or gallops.  ABDOMEN: Soft, Left lower  quadrant tenderness, nondistended. Bowel sounds present. No organomegaly or mass.  EXTREMITIES: No pedal edema, cyanosis, or clubbing.  NEUROLOGIC: Cranial nerves II through XII are intact. Muscle strength 5/5 in all extremities. Sensation intact. Gait not checked.  PSYCHIATRIC: The patient is alert and oriented x 3.  SKIN: No obvious rash, lesion, or ulcer.    LABORATORY PANEL:   CBC  Recent Labs Lab 01/04/17 0505  WBC 10.8*  HGB 15.0  HCT 43.4  PLT 256   ------------------------------------------------------------------------------------------------------------------  Chemistries   Recent Labs Lab 01/03/17 0831  01/04/17 0505  NA 138  < > 140  K 3.8  < > 3.9  CL 103  < > 109  CO2 27  < > 31  GLUCOSE 116*  < > 95  BUN 13  < > 8  CREATININE 1.08  < > 0.85  CALCIUM 9.7  < > 8.6*  AST 20  --   --   ALT 16*  --   --   ALKPHOS 50  --   --   BILITOT 0.8  --   --   < > = values in this interval not displayed. ------------------------------------------------------------------------------------------------------------------  Cardiac Enzymes No results for input(s): TROPONINI in the last 168 hours. ------------------------------------------------------------------------------------------------------------------  RADIOLOGY:  No results found.  EKG:  No orders found for this or any previous visit.  ASSESSMENT AND PLAN:   21 year old male with past medical history significant for anxiety and depression presents to hospital secondary to sudden onset of abdominal pain with bloody diarrhea.  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 No recent travel but [place] to [place] about a month ago. -  Since still having abdominal pain and bloody stools- CT abdomen today - pain meds,  IV fluids and monitor - On Bactrim currently.  #2 depression and anxiety-continue Abilify and trazodone at bedtime.  #3 leukocytosis-secondary to above. Improved  #4 DVT prophylaxis-patient  is ambulatory    All the records are reviewed and case discussed with Care Management/Social Workerr. Management plans discussed with the patient, family and they are in agreement.  CODE STATUS: Full code  TOTAL TIME TAKING CARE OF THIS PATIENT: 38 minutes.   POSSIBLE D/C IN 1-2 DAYS, DEPENDING ON CLINICAL CONDITION.   Enid Baas M.D on 01/04/2017 at 12:45 PM  Between 7am to 6pm - Pager - [phone]  After 6pm go to [email or web address] - password [email or web address]  [facility]  Office  [phone]  CC: Primary care physician; Pcp Not In System

## 2017-01-04 NOTE — Plan of Care (Signed)
Problem: Bowel/Gastric: Goal: Will show no signs and symptoms of gastrointestinal bleeding Outcome: Not Progressing Pt has had 2 medium bright red liquid stools today.  Problem: Fluid Volume: Goal: Will show no signs and symptoms of excessive bleeding Outcome: Progressing Pt VSS, hemoglobin stable.

## 2017-01-05 LAB — CBC
HEMATOCRIT: 44.5 % (ref 40.0–52.0)
Hemoglobin: 15.2 g/dL (ref 13.0–18.0)
MCH: 31.7 pg (ref 26.0–34.0)
MCHC: 34.3 g/dL (ref 32.0–36.0)
MCV: 92.4 fL (ref 80.0–100.0)
Platelets: 251 10*3/uL (ref 150–440)
RBC: 4.81 MIL/uL (ref 4.40–5.90)
RDW: 12.3 % (ref 11.5–14.5)
WBC: 8.8 10*3/uL (ref 3.8–10.6)

## 2017-01-05 LAB — BASIC METABOLIC PANEL
ANION GAP: 6 (ref 5–15)
BUN: 7 mg/dL (ref 6–20)
CALCIUM: 8.9 mg/dL (ref 8.9–10.3)
CO2: 28 mmol/L (ref 22–32)
Chloride: 105 mmol/L (ref 101–111)
Creatinine, Ser: 1.03 mg/dL (ref 0.61–1.24)
Glucose, Bld: 86 mg/dL (ref 65–99)
POTASSIUM: 4 mmol/L (ref 3.5–5.1)
Sodium: 139 mmol/L (ref 135–145)

## 2017-01-05 LAB — HIV ANTIBODY (ROUTINE TESTING W REFLEX): HIV Screen 4th Generation wRfx: NONREACTIVE

## 2017-01-05 MED ORDER — SULFAMETHOXAZOLE-TRIMETHOPRIM 800-160 MG PO TABS: 1.0000 | ORAL_TABLET | Freq: Two times a day (BID) | ORAL | 0 refills | Status: AC

## 2017-01-05 NOTE — Discharge Summary (Signed)
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 Anxiety   . Depression     HOSPITAL COURSE:   21 year old male with past medical history significant for anxiety and depression presents to hospital secondary to sudden onset of abdominal pain with bloody diarrhea.  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 No recent travel but [place] to [place] about a month ago. - CT abdomen showing left sided colitis. No abscess or perforation noted. - On Bactrim with improvement. - able to tolerate regular diet prior to discharge  #2 depression and anxiety-continue Abilify and cymbalta- no changes made to home meds.  #3 leukocytosis-secondary to above. Improved  DISCHARGE CONDITIONS:   Stable  CONSULTS OBTAINED:   None  DRUG ALLERGIES:   No Known Allergies DISCHARGE MEDICATIONS:   Allergies as of 01/05/2017   No Known Allergies     Medication List    STOP taking these medications   traZODone 50 MG tablet Commonly known as:  DESYREL     TAKE these medications   ABILIFY 2 MG tablet Generic drug:  ARIPiprazole Take 2 mg by mouth at bedtime.   DULoxetine 20 MG capsule Commonly known as:  CYMBALTA Take 40 mg by mouth daily.   sulfamethoxazole-trimethoprim 800-160 MG tablet Commonly known as:  BACTRIM DS,SEPTRA DS Take 1 tablet by mouth every 12 (twelve) hours. X 5 more days        DISCHARGE INSTRUCTIONS:   1. PCP f/u in 1-2 weeks  DIET:   Regular diet  ACTIVITY:   Activity as  tolerated  OXYGEN:   Home Oxygen: No.  Oxygen Delivery: room air  DISCHARGE LOCATION:   home   If you experience worsening of your admission symptoms, develop shortness of breath, life threatening emergency, suicidal or homicidal thoughts you must seek medical attention immediately by calling 911 or calling your MD immediately  if symptoms less severe.  You Must read complete instructions/literature along with all the possible adverse reactions/side effects for all the Medicines you take and that have been prescribed to you. Take any new Medicines after you have completely understood and accpet all the possible adverse reactions/side effects.   Please note  You were cared for by a hospitalist during your hospital stay. If you have any questions about your discharge medications or the care you received while you were in the hospital after you are discharged, you can call the unit and asked to speak with the hospitalist on call if the hospitalist that took care of you is not available. Once you are discharged, your primary care physician will handle any further medical issues. Please note that NO REFILLS for any discharge medications will be authorized once you are discharged, as it is imperative that you return to your primary care physician (or establish a relationship with a primary care physician if you do not have one) for your aftercare needs so that they can reassess your need for medications and monitor your lab values.    On the day of Discharge:  VITAL SIGNS:   Blood pressure (!) 112/59, pulse 76, temperature 98.1 F (36.7 C), temperature source Oral, resp. rate 18, height 6' (1.829 m), weight 68 kg (150 lb), SpO2 100 %.  PHYSICAL EXAMINATION:    GENERAL:  21 y.o.-year-old patient lying in the bed with no acute distress.  EYES: Pupils equal, round, reactive to light and accommodation. No scleral icterus. Extraocular muscles intact.  HEENT: Head atraumatic, normocephalic.  Oropharynx and nasopharynx clear.  NECK:  Supple, no jugular venous distention. No thyroid enlargement, no tenderness.  LUNGS: Normal breath sounds bilaterally, no wheezing, rales,rhonchi or crepitation. No use of accessory muscles of respiration.  CARDIOVASCULAR: S1, S2 normal. No murmurs, rubs, or gallops.  ABDOMEN: Soft, minimal Left lower quadrant tenderness, nondistended. Bowel sounds present. No organomegaly or mass.  EXTREMITIES: No pedal edema, cyanosis, or clubbing.  NEUROLOGIC: Cranial nerves II through XII are intact. Muscle strength 5/5 in all extremities. Sensation intact. Gait not checked.  PSYCHIATRIC: The patient is alert and oriented x 3.  SKIN: No obvious rash, lesion, or ulcer.    DATA REVIEW:   CBC  Recent Labs Lab 01/05/17 0422  WBC 8.8  HGB 15.2  HCT 44.5  PLT 251    Chemistries   Recent Labs Lab 01/03/17 0831  01/05/17 0422  NA 138  < > 139  K 3.8  < > 4.0  CL 103  < > 105  CO2 27  < > 28  GLUCOSE 116*  < > 86  BUN 13  < > 7  CREATININE 1.08  < > 1.03  CALCIUM 9.7  < > 8.9  AST 20  --   --   ALT 16*  --   --   ALKPHOS 50  --   --   BILITOT 0.8  --   --   < > = values in this interval not displayed.   Microbiology Results  Results for orders placed or performed during the hospital encounter of 01/03/17  Gastrointestinal Panel by PCR , Stool     Status: Abnormal   Collection Time: 01/03/17  9:14 AM  Result Value Ref Range Status   Campylobacter species NOT DETECTED NOT DETECTED Final   Plesimonas shigelloides NOT DETECTED NOT DETECTED Final   Salmonella species NOT DETECTED NOT DETECTED Final   Yersinia enterocolitica DETECTED (A) NOT DETECTED Final    Comment: RESULT CALLED TO, READ BACK BY AND VERIFIED WITH: LAURA CATES AT 1119 01/03/17 SDR    Vibrio species NOT DETECTED NOT DETECTED Final   Vibrio cholerae NOT DETECTED NOT DETECTED Final   Enteroaggregative E coli (EAEC) NOT DETECTED NOT DETECTED Final   Enteropathogenic E coli  (EPEC) NOT DETECTED NOT DETECTED Final   Enterotoxigenic E coli (ETEC) NOT DETECTED NOT DETECTED Final   Shiga like toxin producing E coli (STEC) NOT DETECTED NOT DETECTED Final   Shigella/Enteroinvasive E coli (EIEC) NOT DETECTED NOT DETECTED Final   Cryptosporidium NOT DETECTED NOT DETECTED Final   Cyclospora cayetanensis NOT DETECTED NOT DETECTED Final   Entamoeba histolytica NOT DETECTED NOT DETECTED Final   Giardia lamblia NOT DETECTED NOT DETECTED Final   Adenovirus F40/41 NOT DETECTED NOT DETECTED Final   Astrovirus NOT DETECTED NOT DETECTED Final   Norovirus GI/GII NOT DETECTED NOT DETECTED Final   Rotavirus A NOT DETECTED NOT DETECTED Final   Sapovirus (I, II, IV, and V) NOT DETECTED NOT DETECTED Final    RADIOLOGY:  Ct Abdomen Pelvis W Contrast  Result Date: 01/04/2017 CLINICAL DATA:  Abdominal pain and bloody diarrhea. Infectious colitis. EXAM: CT ABDOMEN AND PELVIS WITH CONTRAST TECHNIQUE: Multidetector CT imaging of the abdomen and pelvis was performed using the standard protocol following bolus administration of intravenous contrast. CONTRAST:  ISOVUE-300 IOPAMIDOL (ISOVUE-300) INJECTION 61% COMPARISON:  None. FINDINGS: Lower chest: Normal Hepatobiliary: Liver parenchyma is normal. Mild fat adjacent to the falciform ligament, a normal finding. No calcified gallstones. Pancreas: Normal Spleen: Normal Adrenals/Urinary Tract: Adrenal glands are normal. Kidneys are normal. No cyst, mass, stone or hydronephrosis. Bladder is normal. Stomach/Bowel: Small bowel is normal. Right colon is normal. Appendix is normal. There is wall thickening of the left colon consistent with nonspecific colitis. Vascular/Lymphatic: Normal Reproductive: Normal Other: Small amount of free fluid in the pelvis. Musculoskeletal: Normal IMPRESSION: Wall thickening of the left colon consistent with nonspecific colitis. Infectious colitis is consistent with the clinical history. Inflammatory bowel disease could  have a similar appearance. No evidence of abscess. Small amount of free fluid in the pelvis, probably reactive. Electronically Signed   By: Paulina Fusi M.D.   On: 01/04/2017 14:16     Management plans discussed with the patient, family and they are in agreement.  CODE STATUS:     Code Status Orders        Start     Ordered   01/03/17 2140  Full code  Continuous     01/03/17 2139    Code Status History    Date Active Date Inactive Code Status Order ID Comments User Context   This patient has a current code status but no historical code status.      TOTAL TIME TAKING CARE OF THIS PATIENT: 36 minutes.    Ra Pfiester M.D on 01/05/2017 at 2:00 PM  Between 7am to 6pm - Pager - [phone]  After 6pm go to [email or web address] - Social research officer, government  [facility]  Office  [phone]  CC: Primary care physician; Pcp Not In System   Note: This dictation was prepared with Dragon dictation along with smaller phrase technology. Any transcriptional errors that result from this process are unintentional.

## 2017-01-05 NOTE — Progress Notes (Signed)
Pt is being discharged home. Discharge papers given and explained to pt. Pt verbalized understanding. Meds reviewed with pt. RX sent to pharmacy.

## 2017-07-16 IMAGING — CR DG WRIST COMPLETE 3+V*R*
1 series · 4 of 4 positions shown · non-contrast
Comparison: None.

CLINICAL DATA: Fall.

EXAM:
RIGHT WRIST - COMPLETE 3+ VIEW

[Series 1: dg wrist complete right · 0.14mm/px · 4 of 4 slices shown]
[im 1/4]
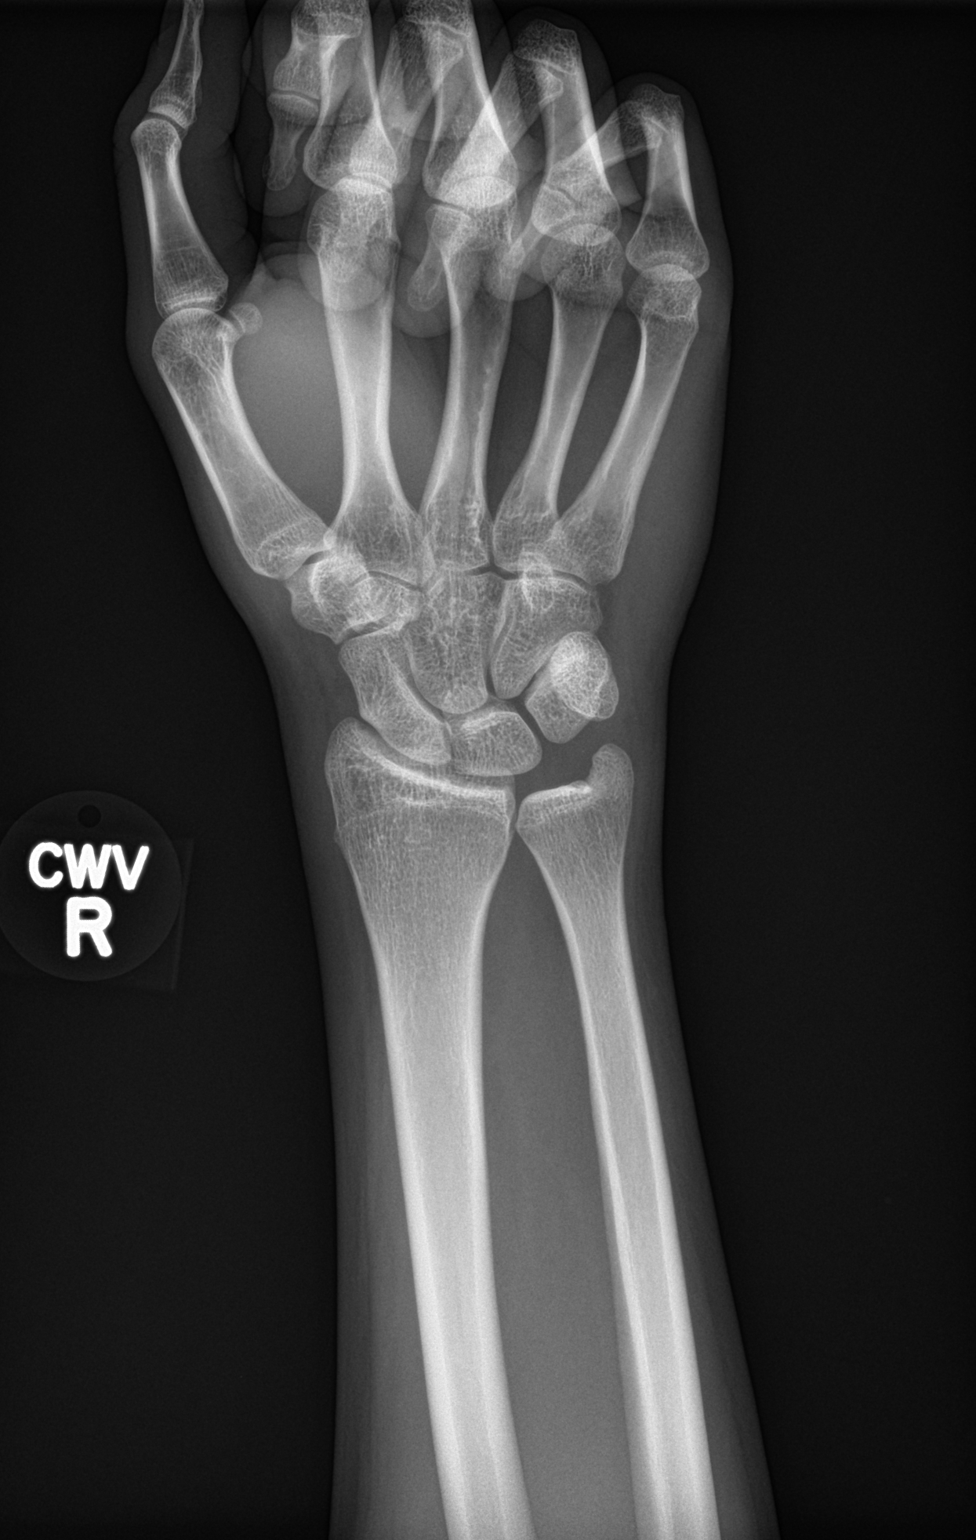
[im 2/4]
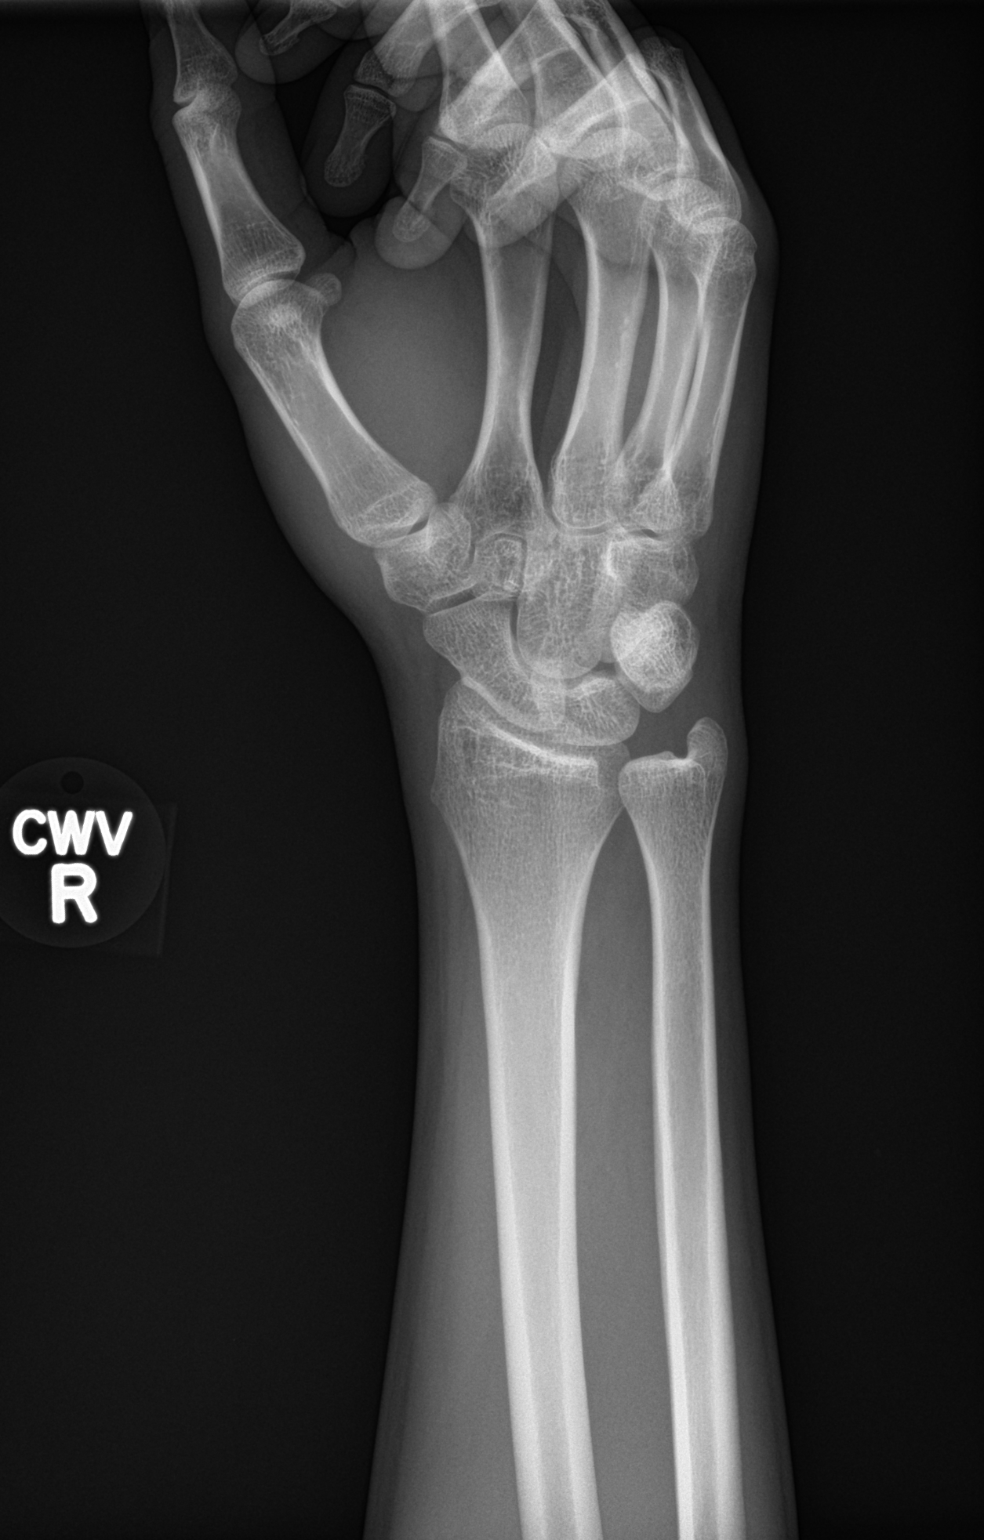
[im 3/4]
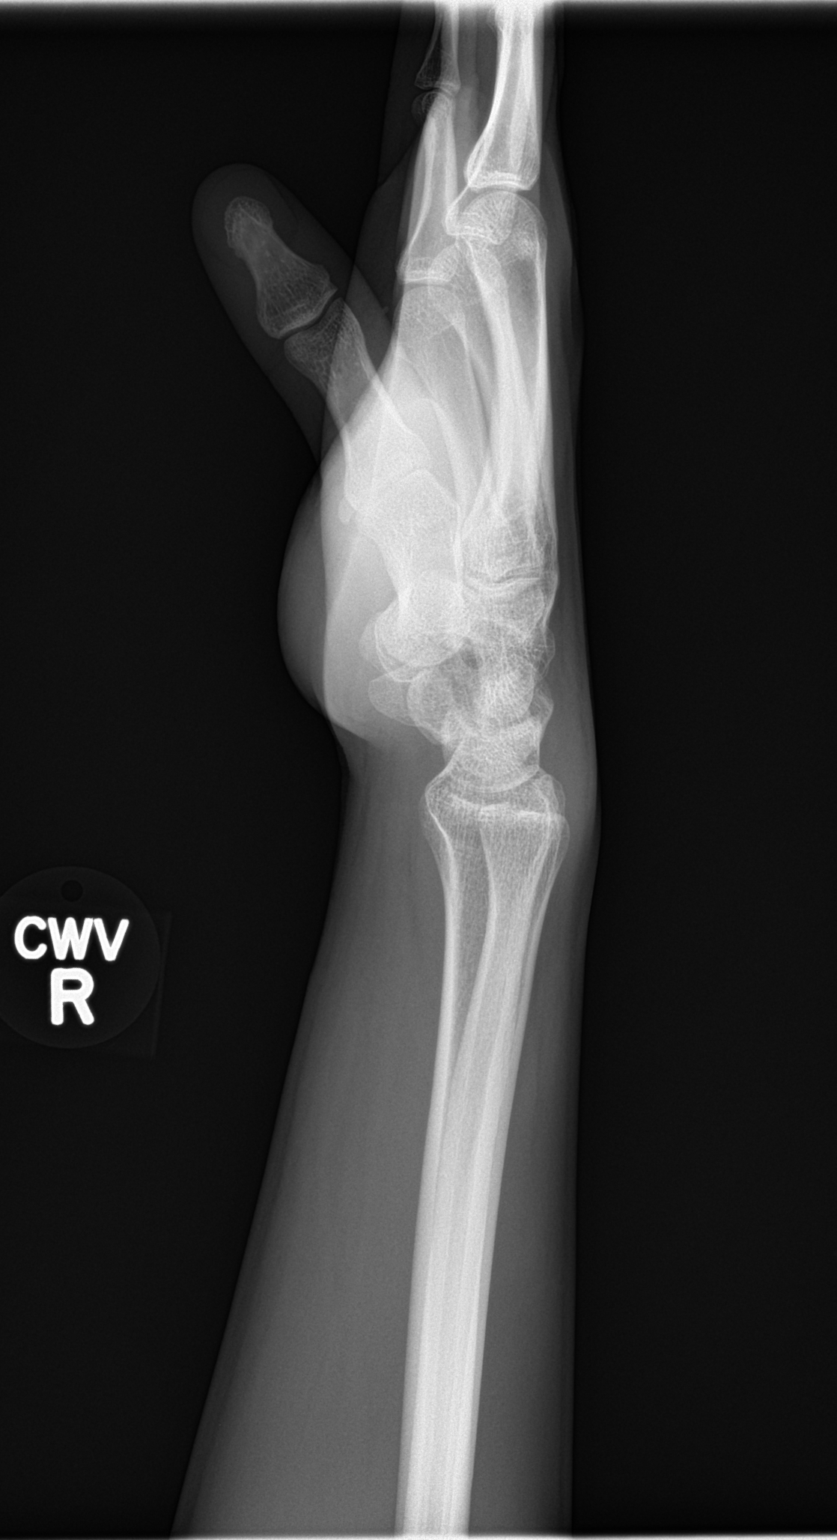
[im 4/4]
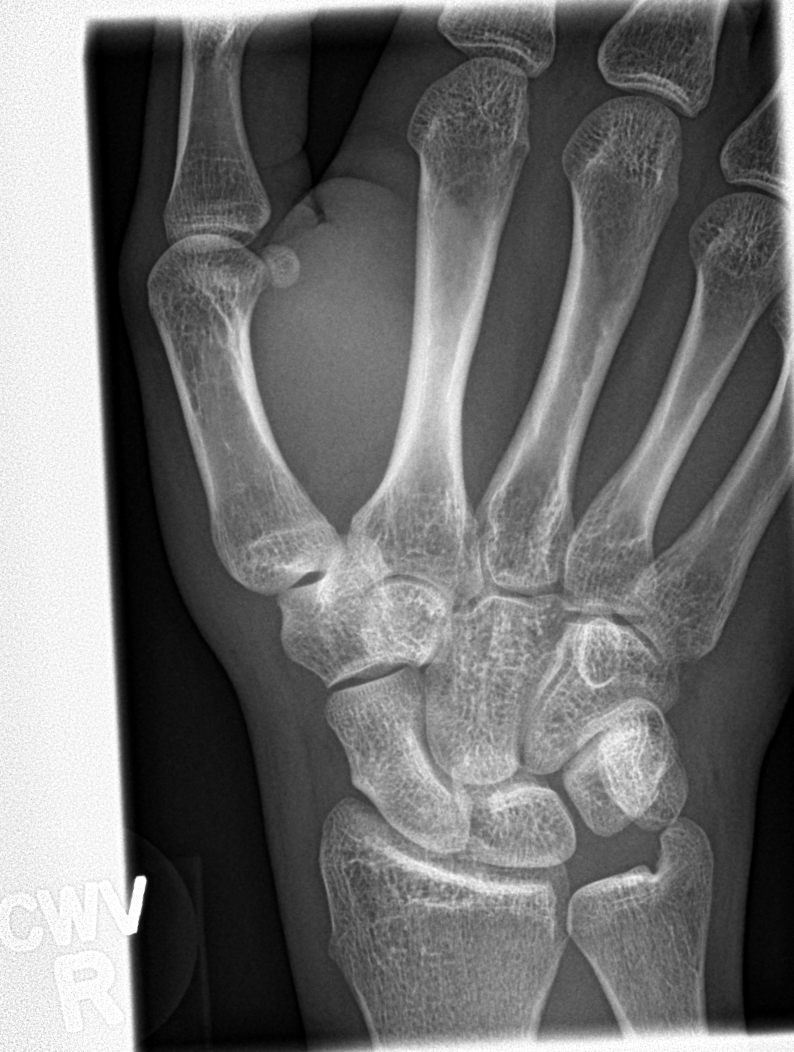

[4 of 4 positions shown; findings below may reference images not displayed]

FINDINGS: No acute bony or joint abnormality identified. No evidence of
fracture or dislocation .
IMPRESSION: No acute abnormality.

## 2018-09-10 IMAGING — CT CT ABD-PELV W/ CM
2 of 7 series · 14 of 46 positions shown, 18 images · IV contrast (APPLIED)
Comparison: None.

CLINICAL DATA: Abdominal pain and bloody diarrhea. Infectious
colitis.

EXAM:
CT ABDOMEN AND PELVIS WITH CONTRAST
TECHNIQUE: Multidetector CT imaging of the abdomen and pelvis was performed
using the standard protocol following bolus administration of
intravenous contrast.
CONTRAST:  100mL 3Z50HR-YRR IOPAMIDOL (3Z50HR-YRR) INJECTION 61%

[Series 2: axial st · axial · 0.66mm/px · z∈[-1028,-654]mm · 11 of 89 slices shown, 15 images]
[im 9/89  soft-tissue]
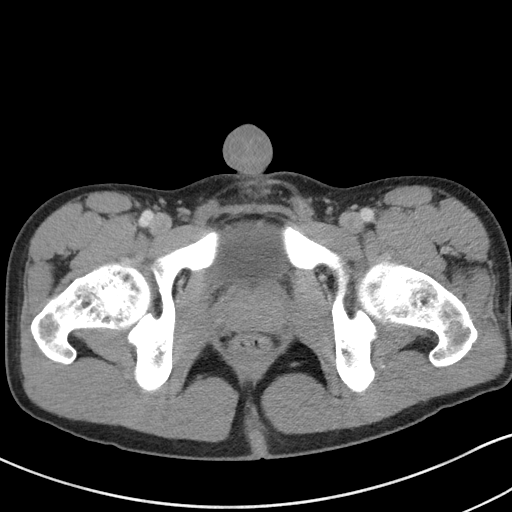
[im 9/89  bone]
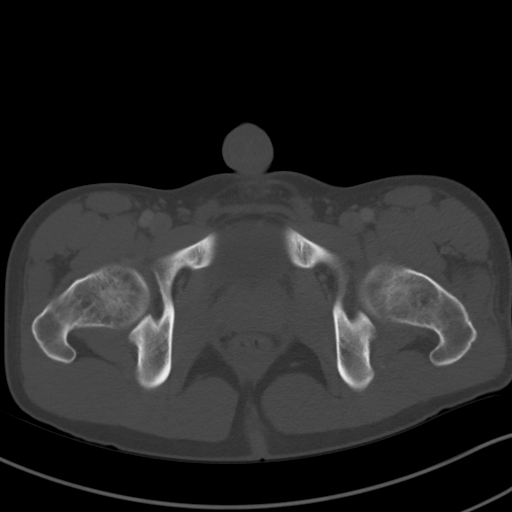
[im 18/89  soft-tissue]
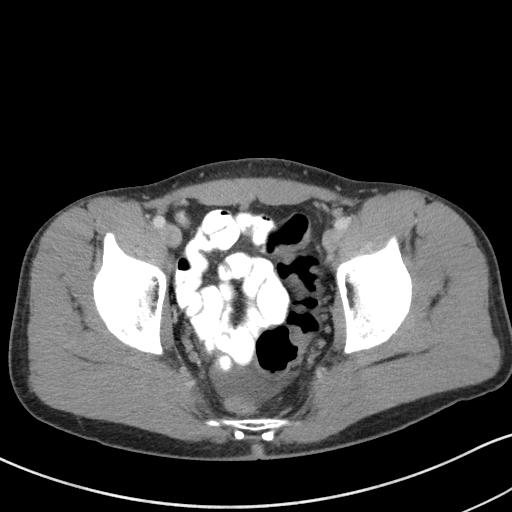
[im 27/89  soft-tissue]
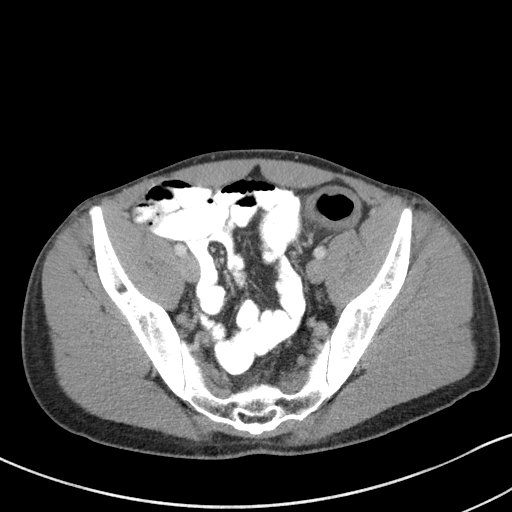
[im 36/89  soft-tissue]
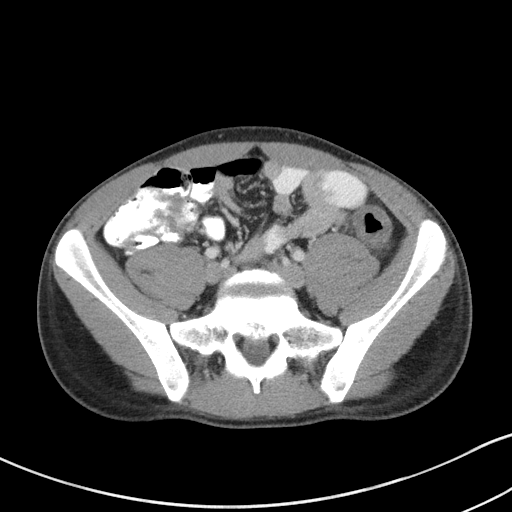
[im 45/89  soft-tissue]
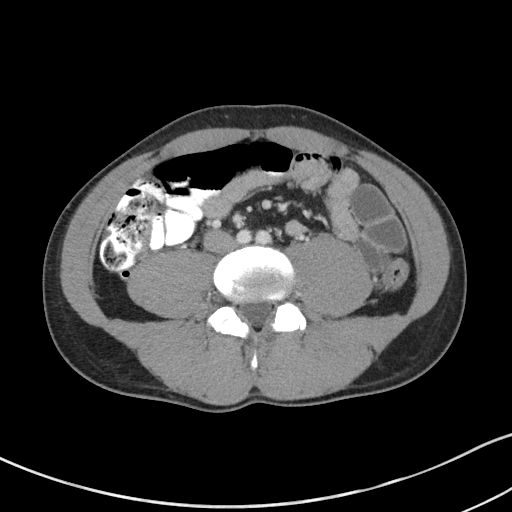
[im 53/89  soft-tissue]
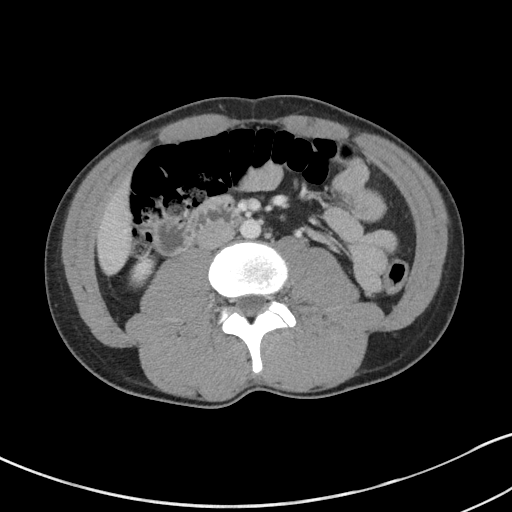
[im 62/89  soft-tissue]
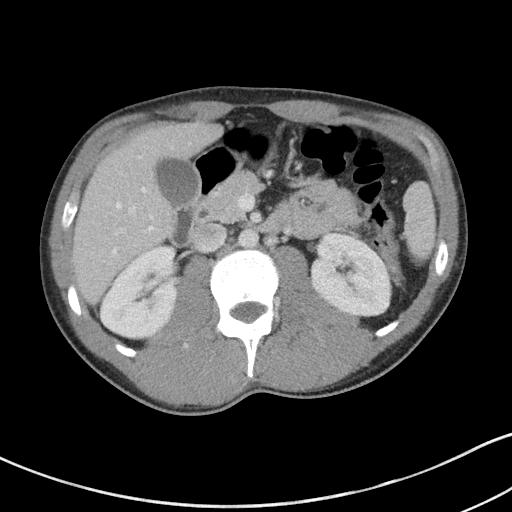
[im 71/89  soft-tissue]
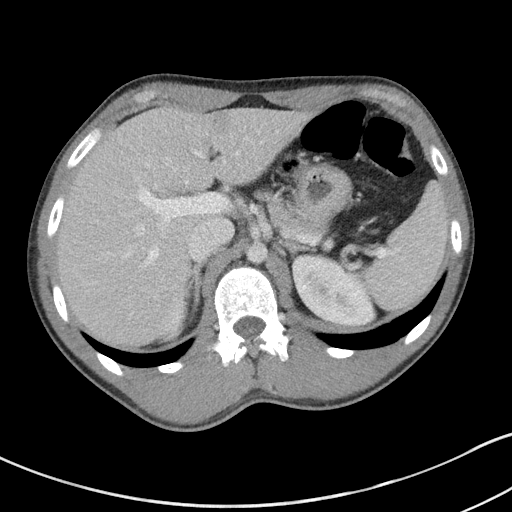
[im 71/89  lung]
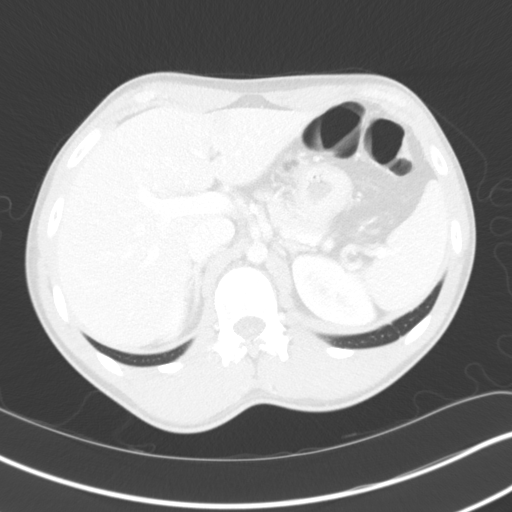
[im 75/89  lung]
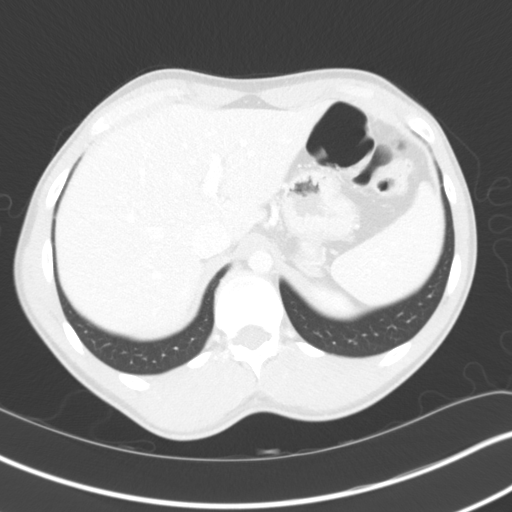
[im 80/89  soft-tissue]
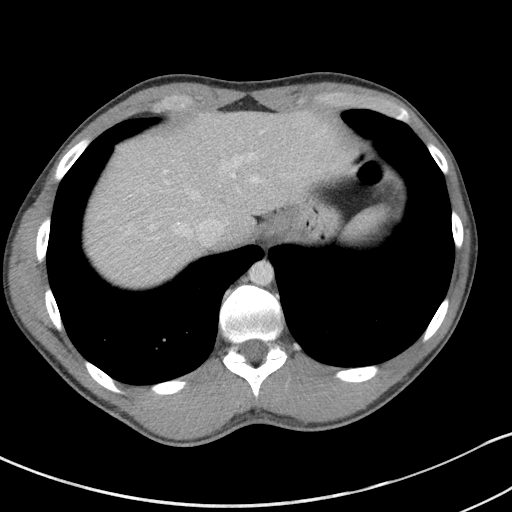
[im 80/89  lung]
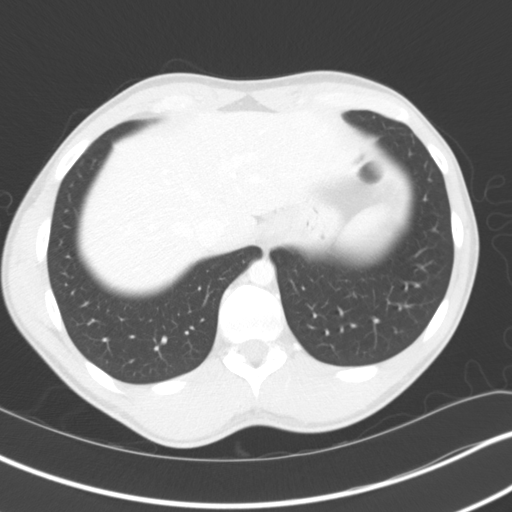
[im 80/89  bone]
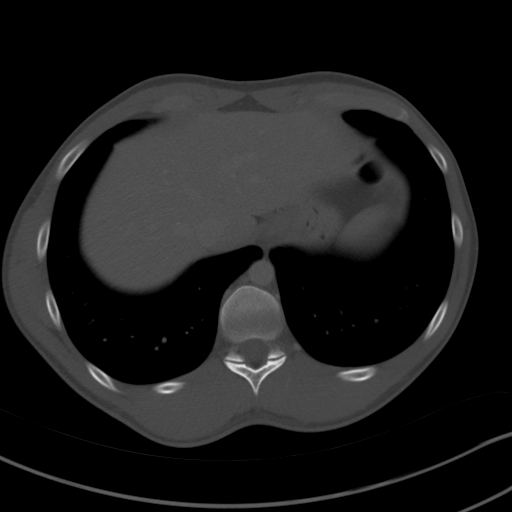
[im 84/89  lung]
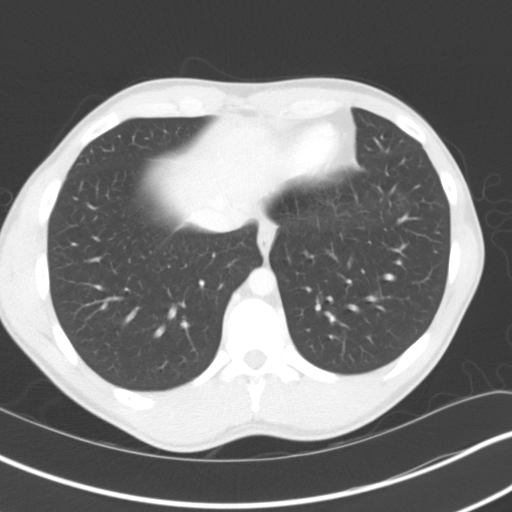

[Series 7: coronal st · coronal · 0.68mm/px · 3 of 79 slices shown]
[im 16/79  soft-tissue]
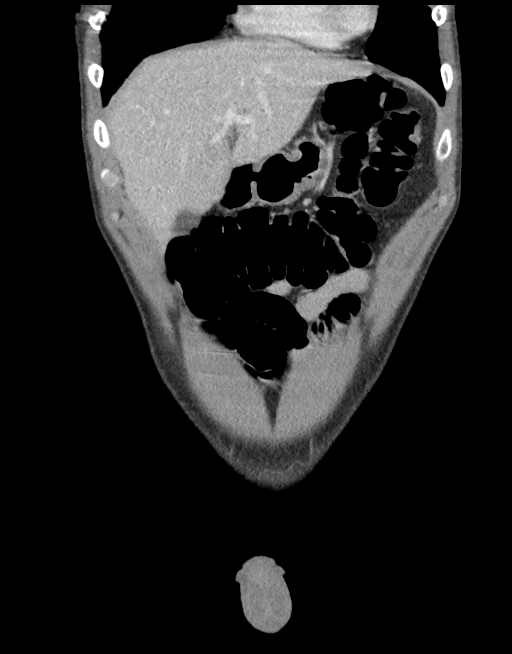
[im 32/79  soft-tissue]
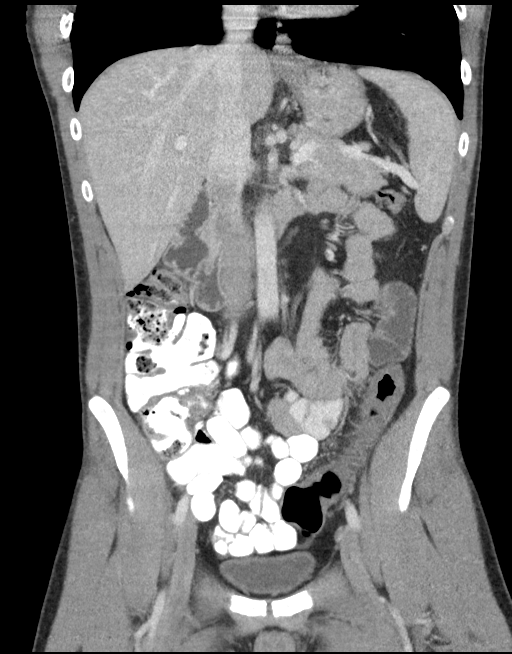
[im 47/79  soft-tissue]
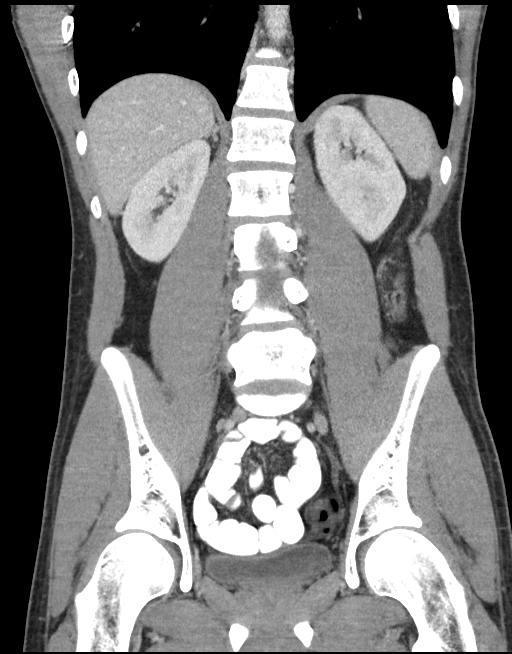

[14 of 46 positions shown; findings below may reference images not displayed]

FINDINGS: Lower chest: Normal

Hepatobiliary: Liver parenchyma is normal. Mild fat adjacent to the
falciform ligament, a normal finding. No calcified gallstones.

Pancreas: Normal

Spleen: Normal

Adrenals/Urinary Tract: Adrenal glands are normal. Kidneys are
normal. No cyst, mass, stone or hydronephrosis. Bladder is normal.

Stomach/Bowel: Small bowel is normal. Right colon is normal.
Appendix is normal. There is wall thickening of the left colon
consistent with nonspecific colitis.

Vascular/Lymphatic: Normal

Reproductive: Normal

Other: Small amount of free fluid in the pelvis.

Musculoskeletal: Normal
IMPRESSION: Wall thickening of the left colon consistent with nonspecific
colitis. Infectious colitis is consistent with the clinical history.
Inflammatory bowel disease could have a similar appearance. No
evidence of abscess. Small amount of free fluid in the pelvis,
probably reactive.
